# Patient Record
Sex: Female | Born: 1950
Health system: Southern US, Community
[De-identification: ages and names within clinical notes are randomized; demographics above are authoritative.]

## PROBLEM LIST (undated history)

## (undated) DIAGNOSIS — E559 Vitamin D deficiency, unspecified: Secondary | ICD-10-CM

## (undated) DIAGNOSIS — I10 Essential (primary) hypertension: Secondary | ICD-10-CM

## (undated) DIAGNOSIS — E119 Type 2 diabetes mellitus without complications: Secondary | ICD-10-CM

## (undated) HISTORY — PX: CHOLECYSTECTOMY: SHX55

## (undated) HISTORY — DX: Essential (primary) hypertension: I10

---

## 2016-05-12 DIAGNOSIS — N950 Postmenopausal bleeding: Secondary | ICD-10-CM | POA: Diagnosis not present

## 2016-05-12 DIAGNOSIS — R8290 Unspecified abnormal findings in urine: Secondary | ICD-10-CM | POA: Diagnosis not present

## 2016-05-12 DIAGNOSIS — N899 Noninflammatory disorder of vagina, unspecified: Secondary | ICD-10-CM | POA: Diagnosis not present

## 2016-05-12 DIAGNOSIS — Z6841 Body Mass Index (BMI) 40.0 and over, adult: Secondary | ICD-10-CM | POA: Diagnosis not present

## 2016-05-21 DIAGNOSIS — N899 Noninflammatory disorder of vagina, unspecified: Secondary | ICD-10-CM | POA: Diagnosis not present

## 2016-05-21 DIAGNOSIS — K625 Hemorrhage of anus and rectum: Secondary | ICD-10-CM | POA: Diagnosis not present

## 2016-05-24 DIAGNOSIS — J45909 Unspecified asthma, uncomplicated: Secondary | ICD-10-CM | POA: Diagnosis not present

## 2016-05-24 DIAGNOSIS — N8501 Benign endometrial hyperplasia: Secondary | ICD-10-CM | POA: Diagnosis not present

## 2016-05-24 DIAGNOSIS — Z8049 Family history of malignant neoplasm of other genital organs: Secondary | ICD-10-CM | POA: Diagnosis not present

## 2016-05-24 DIAGNOSIS — Z818 Family history of other mental and behavioral disorders: Secondary | ICD-10-CM | POA: Diagnosis not present

## 2016-05-24 DIAGNOSIS — Z803 Family history of malignant neoplasm of breast: Secondary | ICD-10-CM | POA: Diagnosis not present

## 2016-05-24 DIAGNOSIS — Z79899 Other long term (current) drug therapy: Secondary | ICD-10-CM | POA: Diagnosis not present

## 2016-05-24 DIAGNOSIS — Z880 Allergy status to penicillin: Secondary | ICD-10-CM | POA: Diagnosis not present

## 2016-05-24 DIAGNOSIS — Z1211 Encounter for screening for malignant neoplasm of colon: Secondary | ICD-10-CM | POA: Diagnosis not present

## 2016-05-24 DIAGNOSIS — K6289 Other specified diseases of anus and rectum: Secondary | ICD-10-CM | POA: Diagnosis not present

## 2016-05-24 DIAGNOSIS — K625 Hemorrhage of anus and rectum: Secondary | ICD-10-CM | POA: Diagnosis not present

## 2016-05-24 DIAGNOSIS — N950 Postmenopausal bleeding: Secondary | ICD-10-CM | POA: Diagnosis not present

## 2016-05-24 DIAGNOSIS — Q524 Other congenital malformations of vagina: Secondary | ICD-10-CM | POA: Diagnosis not present

## 2016-05-24 DIAGNOSIS — Z8489 Family history of other specified conditions: Secondary | ICD-10-CM | POA: Diagnosis not present

## 2016-05-24 DIAGNOSIS — N899 Noninflammatory disorder of vagina, unspecified: Secondary | ICD-10-CM | POA: Diagnosis not present

## 2016-05-24 DIAGNOSIS — E119 Type 2 diabetes mellitus without complications: Secondary | ICD-10-CM | POA: Diagnosis not present

## 2016-05-24 DIAGNOSIS — Z8249 Family history of ischemic heart disease and other diseases of the circulatory system: Secondary | ICD-10-CM | POA: Diagnosis not present

## 2016-05-24 DIAGNOSIS — Q519 Congenital malformation of uterus and cervix, unspecified: Secondary | ICD-10-CM | POA: Diagnosis not present

## 2016-05-24 DIAGNOSIS — Z9049 Acquired absence of other specified parts of digestive tract: Secondary | ICD-10-CM | POA: Diagnosis not present

## 2016-05-25 DIAGNOSIS — Z9049 Acquired absence of other specified parts of digestive tract: Secondary | ICD-10-CM | POA: Diagnosis not present

## 2016-05-25 DIAGNOSIS — Z79899 Other long term (current) drug therapy: Secondary | ICD-10-CM | POA: Diagnosis not present

## 2016-05-25 DIAGNOSIS — K6289 Other specified diseases of anus and rectum: Secondary | ICD-10-CM | POA: Diagnosis not present

## 2016-05-25 DIAGNOSIS — M069 Rheumatoid arthritis, unspecified: Secondary | ICD-10-CM | POA: Diagnosis not present

## 2016-05-25 DIAGNOSIS — Q519 Congenital malformation of uterus and cervix, unspecified: Secondary | ICD-10-CM | POA: Diagnosis not present

## 2016-05-25 DIAGNOSIS — N8501 Benign endometrial hyperplasia: Secondary | ICD-10-CM | POA: Diagnosis not present

## 2016-05-25 DIAGNOSIS — N848 Polyp of other parts of female genital tract: Secondary | ICD-10-CM | POA: Diagnosis not present

## 2016-05-25 DIAGNOSIS — K625 Hemorrhage of anus and rectum: Secondary | ICD-10-CM | POA: Diagnosis not present

## 2016-05-25 DIAGNOSIS — N8500 Endometrial hyperplasia, unspecified: Secondary | ICD-10-CM | POA: Diagnosis not present

## 2016-05-25 DIAGNOSIS — N898 Other specified noninflammatory disorders of vagina: Secondary | ICD-10-CM | POA: Diagnosis not present

## 2016-05-25 DIAGNOSIS — N950 Postmenopausal bleeding: Secondary | ICD-10-CM | POA: Diagnosis not present

## 2016-05-25 DIAGNOSIS — J45909 Unspecified asthma, uncomplicated: Secondary | ICD-10-CM | POA: Diagnosis not present

## 2016-05-25 DIAGNOSIS — Z1211 Encounter for screening for malignant neoplasm of colon: Secondary | ICD-10-CM | POA: Diagnosis not present

## 2016-05-25 DIAGNOSIS — E119 Type 2 diabetes mellitus without complications: Secondary | ICD-10-CM | POA: Diagnosis not present

## 2016-05-25 DIAGNOSIS — Q524 Other congenital malformations of vagina: Secondary | ICD-10-CM | POA: Diagnosis not present

## 2016-05-25 DIAGNOSIS — N899 Noninflammatory disorder of vagina, unspecified: Secondary | ICD-10-CM | POA: Diagnosis not present

## 2016-05-25 LAB — HM COLONOSCOPY

## 2016-06-01 DIAGNOSIS — J45909 Unspecified asthma, uncomplicated: Secondary | ICD-10-CM | POA: Diagnosis not present

## 2016-06-01 DIAGNOSIS — I10 Essential (primary) hypertension: Secondary | ICD-10-CM | POA: Diagnosis not present

## 2016-06-01 DIAGNOSIS — E119 Type 2 diabetes mellitus without complications: Secondary | ICD-10-CM | POA: Diagnosis not present

## 2016-06-01 DIAGNOSIS — K210 Gastro-esophageal reflux disease with esophagitis: Secondary | ICD-10-CM | POA: Diagnosis not present

## 2016-09-17 DIAGNOSIS — Z23 Encounter for immunization: Secondary | ICD-10-CM | POA: Diagnosis not present

## 2016-10-19 DIAGNOSIS — R05 Cough: Secondary | ICD-10-CM | POA: Diagnosis not present

## 2016-10-19 DIAGNOSIS — I700 Atherosclerosis of aorta: Secondary | ICD-10-CM | POA: Diagnosis not present

## 2016-10-19 DIAGNOSIS — J209 Acute bronchitis, unspecified: Secondary | ICD-10-CM | POA: Diagnosis not present

## 2016-11-15 HISTORY — PX: TUMOR REMOVAL: SHX12

## 2017-01-11 DIAGNOSIS — J45909 Unspecified asthma, uncomplicated: Secondary | ICD-10-CM | POA: Diagnosis not present

## 2017-01-11 DIAGNOSIS — E785 Hyperlipidemia, unspecified: Secondary | ICD-10-CM | POA: Diagnosis not present

## 2017-01-11 DIAGNOSIS — E669 Obesity, unspecified: Secondary | ICD-10-CM | POA: Diagnosis not present

## 2017-01-11 DIAGNOSIS — I10 Essential (primary) hypertension: Secondary | ICD-10-CM | POA: Diagnosis not present

## 2017-01-11 DIAGNOSIS — E119 Type 2 diabetes mellitus without complications: Secondary | ICD-10-CM | POA: Diagnosis not present

## 2017-01-11 DIAGNOSIS — K210 Gastro-esophageal reflux disease with esophagitis: Secondary | ICD-10-CM | POA: Diagnosis not present

## 2017-01-11 DIAGNOSIS — Z0000 Encounter for general adult medical examination without abnormal findings: Secondary | ICD-10-CM | POA: Diagnosis not present

## 2017-08-22 DIAGNOSIS — Z23 Encounter for immunization: Secondary | ICD-10-CM | POA: Diagnosis not present

## 2017-09-20 DIAGNOSIS — E119 Type 2 diabetes mellitus without complications: Secondary | ICD-10-CM | POA: Diagnosis not present

## 2017-09-20 DIAGNOSIS — I10 Essential (primary) hypertension: Secondary | ICD-10-CM | POA: Diagnosis not present

## 2017-09-20 DIAGNOSIS — J441 Chronic obstructive pulmonary disease with (acute) exacerbation: Secondary | ICD-10-CM | POA: Diagnosis not present

## 2017-09-20 DIAGNOSIS — E669 Obesity, unspecified: Secondary | ICD-10-CM | POA: Diagnosis not present

## 2018-03-16 DIAGNOSIS — E119 Type 2 diabetes mellitus without complications: Secondary | ICD-10-CM | POA: Diagnosis not present

## 2018-03-16 DIAGNOSIS — J45909 Unspecified asthma, uncomplicated: Secondary | ICD-10-CM | POA: Diagnosis not present

## 2018-03-16 DIAGNOSIS — J301 Allergic rhinitis due to pollen: Secondary | ICD-10-CM | POA: Diagnosis not present

## 2018-03-16 DIAGNOSIS — I10 Essential (primary) hypertension: Secondary | ICD-10-CM | POA: Diagnosis not present

## 2018-04-19 DIAGNOSIS — E559 Vitamin D deficiency, unspecified: Secondary | ICD-10-CM | POA: Diagnosis not present

## 2018-04-19 DIAGNOSIS — Z0000 Encounter for general adult medical examination without abnormal findings: Secondary | ICD-10-CM | POA: Diagnosis not present

## 2018-04-19 DIAGNOSIS — J45909 Unspecified asthma, uncomplicated: Secondary | ICD-10-CM | POA: Diagnosis not present

## 2018-04-19 DIAGNOSIS — E1169 Type 2 diabetes mellitus with other specified complication: Secondary | ICD-10-CM | POA: Diagnosis not present

## 2018-04-19 DIAGNOSIS — J301 Allergic rhinitis due to pollen: Secondary | ICD-10-CM | POA: Diagnosis not present

## 2018-04-19 DIAGNOSIS — E119 Type 2 diabetes mellitus without complications: Secondary | ICD-10-CM | POA: Diagnosis not present

## 2018-04-19 DIAGNOSIS — I10 Essential (primary) hypertension: Secondary | ICD-10-CM | POA: Diagnosis not present

## 2018-04-19 LAB — MICROALBUMIN, URINE: Microalb, Ur: 27.7

## 2018-04-20 DIAGNOSIS — Z0000 Encounter for general adult medical examination without abnormal findings: Secondary | ICD-10-CM | POA: Diagnosis not present

## 2018-06-15 ENCOUNTER — Other Ambulatory Visit: Payer: Self-pay

## 2018-07-13 DIAGNOSIS — J301 Allergic rhinitis due to pollen: Secondary | ICD-10-CM | POA: Diagnosis not present

## 2018-07-13 DIAGNOSIS — E1169 Type 2 diabetes mellitus with other specified complication: Secondary | ICD-10-CM | POA: Diagnosis not present

## 2018-07-13 DIAGNOSIS — E785 Hyperlipidemia, unspecified: Secondary | ICD-10-CM | POA: Diagnosis not present

## 2018-07-13 DIAGNOSIS — E669 Obesity, unspecified: Secondary | ICD-10-CM | POA: Diagnosis not present

## 2018-07-13 DIAGNOSIS — J45909 Unspecified asthma, uncomplicated: Secondary | ICD-10-CM | POA: Diagnosis not present

## 2018-07-13 DIAGNOSIS — I10 Essential (primary) hypertension: Secondary | ICD-10-CM | POA: Diagnosis not present

## 2018-07-13 DIAGNOSIS — K210 Gastro-esophageal reflux disease with esophagitis: Secondary | ICD-10-CM | POA: Diagnosis not present

## 2018-07-13 DIAGNOSIS — E119 Type 2 diabetes mellitus without complications: Secondary | ICD-10-CM | POA: Diagnosis not present

## 2018-07-21 DIAGNOSIS — E1165 Type 2 diabetes mellitus with hyperglycemia: Secondary | ICD-10-CM | POA: Diagnosis not present

## 2018-07-21 DIAGNOSIS — E669 Obesity, unspecified: Secondary | ICD-10-CM | POA: Diagnosis not present

## 2018-07-21 DIAGNOSIS — I10 Essential (primary) hypertension: Secondary | ICD-10-CM | POA: Diagnosis not present

## 2018-07-21 DIAGNOSIS — R42 Dizziness and giddiness: Secondary | ICD-10-CM | POA: Diagnosis not present

## 2018-07-21 DIAGNOSIS — Z23 Encounter for immunization: Secondary | ICD-10-CM | POA: Diagnosis not present

## 2018-11-15 LAB — HM MAMMOGRAPHY: HM Mammogram: NORMAL (ref 0–4)

## 2018-11-29 ENCOUNTER — Other Ambulatory Visit: Payer: Self-pay

## 2018-11-29 ENCOUNTER — Emergency Department (HOSPITAL_COMMUNITY): Payer: Medicare Other

## 2018-11-29 ENCOUNTER — Encounter (HOSPITAL_COMMUNITY): Payer: Self-pay | Admitting: Emergency Medicine

## 2018-11-29 ENCOUNTER — Emergency Department (HOSPITAL_COMMUNITY)
Admission: EM | Admit: 2018-11-29 | Discharge: 2018-11-29 | Disposition: A | Discharge status: Home / Self Care | Payer: Medicare Other | Attending: Emergency Medicine | Admitting: Emergency Medicine

## 2018-11-29 DIAGNOSIS — Z7984 Long term (current) use of oral hypoglycemic drugs: Secondary | ICD-10-CM | POA: Diagnosis not present

## 2018-11-29 DIAGNOSIS — R000 Tachycardia, unspecified: Secondary | ICD-10-CM | POA: Diagnosis not present

## 2018-11-29 DIAGNOSIS — Z79899 Other long term (current) drug therapy: Secondary | ICD-10-CM | POA: Insufficient documentation

## 2018-11-29 DIAGNOSIS — M79661 Pain in right lower leg: Secondary | ICD-10-CM | POA: Diagnosis not present

## 2018-11-29 DIAGNOSIS — R079 Chest pain, unspecified: Secondary | ICD-10-CM | POA: Diagnosis not present

## 2018-11-29 DIAGNOSIS — E119 Type 2 diabetes mellitus without complications: Secondary | ICD-10-CM | POA: Insufficient documentation

## 2018-11-29 DIAGNOSIS — R0789 Other chest pain: Secondary | ICD-10-CM | POA: Diagnosis not present

## 2018-11-29 DIAGNOSIS — I700 Atherosclerosis of aorta: Secondary | ICD-10-CM | POA: Diagnosis not present

## 2018-11-29 DIAGNOSIS — R0602 Shortness of breath: Secondary | ICD-10-CM | POA: Diagnosis not present

## 2018-11-29 HISTORY — DX: Vitamin D deficiency, unspecified: E55.9

## 2018-11-29 HISTORY — DX: Type 2 diabetes mellitus without complications: E11.9

## 2018-11-29 LAB — CBC
HCT: 51.4 % — ABNORMAL HIGH (ref 36.0–46.0)
Hemoglobin: 16.3 g/dL — ABNORMAL HIGH (ref 12.0–15.0)
MCH: 28.3 pg (ref 26.0–34.0)
MCHC: 31.7 g/dL (ref 30.0–36.0)
MCV: 89.2 fL (ref 80.0–100.0)
Platelets: 234 10*3/uL (ref 150–400)
RBC: 5.76 MIL/uL — ABNORMAL HIGH (ref 3.87–5.11)
RDW: 13 % (ref 11.5–15.5)
WBC: 8 10*3/uL (ref 4.0–10.5)
nRBC: 0 % (ref 0.0–0.2)

## 2018-11-29 LAB — BASIC METABOLIC PANEL
Anion gap: 11 (ref 5–15)
BUN: 16 mg/dL (ref 8–23)
CHLORIDE: 99 mmol/L (ref 98–111)
CO2: 26 mmol/L (ref 22–32)
Calcium: 9.3 mg/dL (ref 8.9–10.3)
Creatinine, Ser: 0.73 mg/dL (ref 0.44–1.00)
GFR calc Af Amer: >60 mL/min (ref >60)
GFR calc non Af Amer: >60 mL/min (ref >60)
Glucose, Bld: 196 mg/dL — ABNORMAL HIGH (ref 70–99)
Potassium: 4.3 mmol/L (ref 3.5–5.1)
SODIUM: 136 mmol/L (ref 135–145)

## 2018-11-29 LAB — D-DIMER, QUANTITATIVE: D-Dimer, Quant: 0.48 ug/mL-FEU (ref 0.00–0.50)

## 2018-11-29 LAB — TSH: TSH: 1.13 u[IU]/mL (ref 0.350–4.500)

## 2018-11-29 LAB — TROPONIN I: Troponin I: <0.03 ng/mL (ref <0.03)

## 2018-11-29 MED ORDER — IOPAMIDOL (ISOVUE-370) INJECTION 76%
150.0000 mL | Freq: Once | INTRAVENOUS | Status: AC | PRN
  Administered 2018-11-29: 100 mL via INTRAVENOUS

## 2018-11-29 NOTE — ED Notes (Signed)
EKG given to Cook MD 

## 2018-11-29 NOTE — ED Triage Notes (Signed)
CP and sob since 0400 today.  Sent by Dr Juanetta Gosling office

## 2018-11-29 NOTE — Discharge Instructions (Addendum)
Tests were all normal the exception of some nonspecific tissue in your chest.  We discussed this briefly.  Follow-up with your primary care doctor.

## 2018-11-29 NOTE — ED Provider Notes (Signed)
[facility]   CSN: [id number] Arrival date & time: 11/29/18  1316     History   Chief Complaint Chief Complaint  Patient presents with  . Chest Pain    HPI Eduardo OsierShirley W Salas is a 68 y.o. female.  Patient sent to the ED by her primary care doctor for rapid heart rate since 4:30 AM.  Additionally, she complains of chest pain with radiation to the back.  This has never happened before.  Review of systems positive for dyspnea and right calf tenderness, but no diaphoresis or nausea.  She is diabetic.  Non-smoker.  No prolonged immobilization or recent travel.  Severity is moderate.  Nothing makes symptoms better or worse.     Past Medical History:  Diagnosis Date  . Diabetes mellitus without complication (HCC)   . Vitamin D deficiency     There are no active problems to display for this patient.   Past Surgical History:  Procedure Laterality Date  . CHOLECYSTECTOMY    . TUMOR REMOVAL  2018     OB History   No obstetric history on file.      Home Medications    Prior to Admission medications   Medication Sig Start Date End Date Taking? Authorizing Provider  Cholecalciferol (VITAMIN D) 50 MCG (2000 UT) CAPS Take 1 capsule by mouth daily.   Yes [provider]  glipiZIDE (GLUCOTROL) 5 MG tablet Take 5 mg by mouth 2 (two) times daily. 10/11/18  Yes [provider]  nystatin (MYCOSTATIN/NYSTOP) powder Apply 1 application topically daily as needed. 09/01/18  Yes [provider]  vitamin C (ASCORBIC ACID) 500 MG tablet Take 500 mg by mouth daily.   Yes [provider]    Family History No family history on file.  Social History Social History   Tobacco Use  . Smoking status: Never Smoker  . Smokeless tobacco: Never Used  Substance Use Topics  . Alcohol use: Never    Frequency: Never  . Drug use: Never     Allergies   Penicillins and Metformin and related   Review of Systems Review of  Systems  All other systems reviewed and are negative.    Physical Exam Updated Vital Signs BP 132/75   Pulse 84   Temp 98.2 F (36.8 C) (Oral)   Resp 11   Ht 5\' 2"  (1.575 m)   Wt 104.3 kg   SpO2 96%   BMI 42.07 kg/m   Physical Exam Vitals signs and nursing note reviewed.  Constitutional:      Appearance: She is well-developed.     Comments: Elevated bmi  HENT:     Head: Normocephalic and atraumatic.  Eyes:     Conjunctiva/sclera: Conjunctivae normal.  Neck:     Musculoskeletal: Neck supple.  Cardiovascular:     Rate and Rhythm: Regular rhythm. Tachycardia present.  Pulmonary:     Effort: Pulmonary effort is normal.     Breath sounds: Normal breath sounds.  Abdominal:     General: Bowel sounds are normal.     Palpations: Abdomen is soft.  Musculoskeletal: Normal range of motion.  Skin:    General: Skin is warm and dry.  Neurological:     Mental Status: She is alert and oriented to person, place, and time.  Psychiatric:        Behavior: Behavior normal.      ED Treatments / Results  Labs (all labs ordered are listed, but only abnormal results are  displayed) Labs Reviewed  BASIC METABOLIC PANEL - Abnormal; Notable for the following components:      Result Value   Glucose, Bld 196 (*)    All other components within normal limits  CBC - Abnormal; Notable for the following components:   RBC 5.76 (*)    Hemoglobin 16.3 (*)    HCT 51.4 (*)    All other components within normal limits  TROPONIN I  D-DIMER, QUANTITATIVE (NOT AT [facility])  TSH    EKG None  Date: 11/29/2018  Rate: 134  Rhythm: sinus tachy  QRS Axis: normal  Intervals: normal  ST/T Wave abnormalities: normal  Conduction Disutrbances: none  Narrative Interpretation: unremarkable    Radiology Dg Chest 2 View  Result Date: 11/29/2018 CLINICAL DATA:  Chest pain and shortness of breath since 0400 hours today EXAM: CHEST - 2 VIEW COMPARISON:  None FINDINGS: Upper normal heart size.  Mediastinal contours and pulmonary vascularity normal. Atherosclerotic calcification aorta. Minimal diffuse interstitial prominence of uncertain acuity. No segmental consolidation, pleural effusion or pneumothorax. Bones demineralized with LEFT glenohumeral degenerative changes noted. IMPRESSION: No definite acute infiltrates. Electronically Signed   By: Ulyses SouthwardMark  Boles M.D.   On: 11/29/2018 14:44   Ct Angio Chest Pe W And/or Wo Contrast  Result Date: 11/29/2018 CLINICAL DATA:  68 year old female with chest pain, pressure and shortness of breath since 0400 hours. EXAM: CT ANGIOGRAPHY CHEST WITH CONTRAST TECHNIQUE: Multidetector CT imaging of the chest was performed using the standard protocol during bolus administration of intravenous contrast. Multiplanar CT image reconstructions and MIPs were obtained to evaluate the vascular anatomy. CONTRAST:  100mL ISOVUE-370 IOPAMIDOL (ISOVUE-370) INJECTION 76% COMPARISON:  Chest radiographs 1425 hours today. FINDINGS: Cardiovascular: Adequate contrast bolus timing in the pulmonary arterial tree. No focal filling defect identified in the pulmonary arteries to suggest acute pulmonary embolism. Calcified coronary artery and Calcified aortic atherosclerosis. Mild cardiomegaly. No pericardial effusion. Mediastinum/Nodes: Bilateral abnormally increased hilar soft tissue appears fairly symmetric and might reflect hilar lymphadenopathy. The soft tissue ranges from 9 to 11 millimeters in thickness bilaterally. Mediastinal and right paratracheal lymph nodes are within normal limits. No axillary or thoracic inlet lymphadenopathy. Lungs/Pleura: Major airways are patent although some appear narrow due at the hila related to the increased hilar soft tissue. No endobronchial mass or lesion. Mild dependent atelectasis in both lungs. No pleural effusion. Mild increased subpleural reticular opacity in both mid to lower lungs. No lung nodule or mass identified. Upper Abdomen: Negative visible  liver, spleen, right adrenal gland and stomach. Musculoskeletal: No acute or suspicious osseous lesion. Left glenohumeral joint degeneration is moderate to severe. Review of the MIP images confirms the above findings. IMPRESSION: 1. Negative for acute pulmonary embolus. 2. Abnormal bilateral hilar soft tissue is fairly symmetric, nonspecific bilateral hilar lymphadenopathy is favored. No superimposed mediastinal, axillary, or upper abdominal lymphadenopathy. No lung nodule, lung mass, endobronchial lesion, or acute pulmonary process identified. Consider follow-up with Pulmonology. 3. Calcified coronary artery and aortic Atherosclerosis (ICD10-I70.0). Electronically Signed   By: Odessa FlemingH  Hall M.D.   On: 11/29/2018 17:14   [place] Venous Img Lower Unilateral Right  Result Date: 11/29/2018 CLINICAL DATA:  Right calf pain and short of breath for 1 day EXAM: RIGHT LOWER EXTREMITY VENOUS DUPLEX ULTRASOUND TECHNIQUE: Doppler venous assessment of the right lower extremity deep venous system was performed, including characterization of spectral flow, compressibility, and phasicity. COMPARISON:  None. FINDINGS: There is complete compressibility of the right common femoral, femoral, and popliteal veins. Doppler analysis demonstrates respiratory phasicity  and augmentation of flow with calf compression. No obvious superficial vein or calf vein thrombosis. IMPRESSION: No evidence of right lower extremity DVT. Electronically Signed   By: Jolaine Click M.D.   On: 11/29/2018 15:44    Procedures Procedures (including critical care time)  Medications Ordered in ED Medications  iopamidol (ISOVUE-370) 76 % injection 150 mL (100 mLs Intravenous Contrast Given 11/29/18 1655)     Initial Impression / Assessment and Plan / ED Course  I have reviewed the triage vital signs and the nursing notes.  Pertinent labs & imaging results that were available during my care of the patient were reviewed by me and considered in my medical decision  making (see chart for details).     Patient presents with tachycardia and chest pain of unknown etiology.  Her pulse normalized over time.  Screening tests including hemoglobin, TSH, Doppler right lower extremity, CT angiogram of chest all negative.  Glucose 196.  Abnormal soft tissue noted in the hilar area bilaterally.  This was discussed with the patient and her daughter-in-law.  She will follow-up with her primary care doctor.  Pulse normal at discharge.  Final Clinical Impressions(s) / ED Diagnoses   Final diagnoses:  Sinus tachycardia    ED Discharge Orders    None       Donnetta Hutching, MD 11/29/18 [phone]

## 2018-12-05 ENCOUNTER — Encounter: Payer: Self-pay | Admitting: Family Medicine

## 2018-12-05 DIAGNOSIS — E1165 Type 2 diabetes mellitus with hyperglycemia: Secondary | ICD-10-CM | POA: Diagnosis not present

## 2018-12-08 DIAGNOSIS — R42 Dizziness and giddiness: Secondary | ICD-10-CM | POA: Diagnosis not present

## 2018-12-08 DIAGNOSIS — I10 Essential (primary) hypertension: Secondary | ICD-10-CM | POA: Diagnosis not present

## 2018-12-08 DIAGNOSIS — J301 Allergic rhinitis due to pollen: Secondary | ICD-10-CM | POA: Diagnosis not present

## 2018-12-08 DIAGNOSIS — J45909 Unspecified asthma, uncomplicated: Secondary | ICD-10-CM | POA: Diagnosis not present

## 2019-03-08 DIAGNOSIS — E119 Type 2 diabetes mellitus without complications: Secondary | ICD-10-CM | POA: Diagnosis not present

## 2019-03-08 DIAGNOSIS — I10 Essential (primary) hypertension: Secondary | ICD-10-CM | POA: Diagnosis not present

## 2019-03-08 DIAGNOSIS — J45909 Unspecified asthma, uncomplicated: Secondary | ICD-10-CM | POA: Diagnosis not present

## 2019-05-16 DIAGNOSIS — R0600 Dyspnea, unspecified: Secondary | ICD-10-CM | POA: Diagnosis not present

## 2019-05-16 DIAGNOSIS — I10 Essential (primary) hypertension: Secondary | ICD-10-CM | POA: Diagnosis not present

## 2019-05-16 DIAGNOSIS — E1169 Type 2 diabetes mellitus with other specified complication: Secondary | ICD-10-CM | POA: Diagnosis not present

## 2019-05-16 DIAGNOSIS — J301 Allergic rhinitis due to pollen: Secondary | ICD-10-CM | POA: Diagnosis not present

## 2019-06-14 ENCOUNTER — Other Ambulatory Visit: Payer: Self-pay

## 2019-08-03 DIAGNOSIS — E669 Obesity, unspecified: Secondary | ICD-10-CM | POA: Diagnosis not present

## 2019-08-03 DIAGNOSIS — E785 Hyperlipidemia, unspecified: Secondary | ICD-10-CM | POA: Diagnosis not present

## 2019-08-03 DIAGNOSIS — J45909 Unspecified asthma, uncomplicated: Secondary | ICD-10-CM | POA: Diagnosis not present

## 2019-08-03 DIAGNOSIS — I10 Essential (primary) hypertension: Secondary | ICD-10-CM | POA: Diagnosis not present

## 2019-08-03 DIAGNOSIS — E559 Vitamin D deficiency, unspecified: Secondary | ICD-10-CM | POA: Diagnosis not present

## 2019-08-03 DIAGNOSIS — E1169 Type 2 diabetes mellitus with other specified complication: Secondary | ICD-10-CM | POA: Diagnosis not present

## 2019-08-03 DIAGNOSIS — K210 Gastro-esophageal reflux disease with esophagitis: Secondary | ICD-10-CM | POA: Diagnosis not present

## 2019-08-03 LAB — COMPREHENSIVE METABOLIC PANEL
Albumin: 4 (ref 3.5–5.0)
GFR calc Af Amer: 73
GFR calc non Af Amer: 63
Globulin: 2.8

## 2019-08-03 LAB — BASIC METABOLIC PANEL
BUN: 19 (ref 4–21)
CO2: 26 — AB (ref 13–22)
Chloride: 101 (ref 99–108)
Creatinine: 0.9 (ref <=1.1)
Glucose: 157
Potassium: 4.3 (ref 3.4–5.3)
Sodium: 139 (ref 137–147)

## 2019-08-03 LAB — HEPATIC FUNCTION PANEL
ALT: 13 (ref 7–35)
AST: 16 (ref 13–35)
Alkaline Phosphatase: 88 (ref 25–125)
Bilirubin, Total: 0.5

## 2019-08-03 LAB — LIPID PANEL
Cholesterol: 162 (ref 0–200)
HDL: 47 (ref 35–70)
LDL Cholesterol: 100
Triglycerides: 67 (ref 40–160)

## 2019-08-03 LAB — VITAMIN D 25 HYDROXY (VIT D DEFICIENCY, FRACTURES): Vit D, 25-Hydroxy: 22

## 2019-08-03 LAB — HEMOGLOBIN A1C: Hemoglobin A1C: 7.7

## 2019-08-08 DIAGNOSIS — E669 Obesity, unspecified: Secondary | ICD-10-CM | POA: Diagnosis not present

## 2019-08-08 DIAGNOSIS — I10 Essential (primary) hypertension: Secondary | ICD-10-CM | POA: Diagnosis not present

## 2019-08-08 DIAGNOSIS — Z23 Encounter for immunization: Secondary | ICD-10-CM | POA: Diagnosis not present

## 2019-08-08 DIAGNOSIS — E1165 Type 2 diabetes mellitus with hyperglycemia: Secondary | ICD-10-CM | POA: Diagnosis not present

## 2019-08-08 DIAGNOSIS — E785 Hyperlipidemia, unspecified: Secondary | ICD-10-CM | POA: Diagnosis not present

## 2019-10-08 ENCOUNTER — Other Ambulatory Visit: Payer: Self-pay

## 2019-10-08 ENCOUNTER — Encounter (INDEPENDENT_AMBULATORY_CARE_PROVIDER_SITE_OTHER): Payer: Self-pay

## 2019-10-08 ENCOUNTER — Encounter: Payer: Self-pay | Admitting: Family Medicine

## 2019-10-08 ENCOUNTER — Ambulatory Visit (INDEPENDENT_AMBULATORY_CARE_PROVIDER_SITE_OTHER): Payer: Medicare Other | Admitting: Family Medicine

## 2019-10-08 VITALS — BP 177/92 | HR 95 | Temp 97.8°F | Resp 15 | Ht 62.0 in | Wt 241.2 lb

## 2019-10-08 DIAGNOSIS — E119 Type 2 diabetes mellitus without complications: Secondary | ICD-10-CM | POA: Diagnosis not present

## 2019-10-08 DIAGNOSIS — M25561 Pain in right knee: Secondary | ICD-10-CM

## 2019-10-08 DIAGNOSIS — G8929 Other chronic pain: Secondary | ICD-10-CM

## 2019-10-08 DIAGNOSIS — H9313 Tinnitus, bilateral: Secondary | ICD-10-CM | POA: Diagnosis not present

## 2019-10-08 DIAGNOSIS — R42 Dizziness and giddiness: Secondary | ICD-10-CM

## 2019-10-08 DIAGNOSIS — I10 Essential (primary) hypertension: Secondary | ICD-10-CM

## 2019-10-08 DIAGNOSIS — E1165 Type 2 diabetes mellitus with hyperglycemia: Secondary | ICD-10-CM | POA: Insufficient documentation

## 2019-10-08 DIAGNOSIS — M25562 Pain in left knee: Secondary | ICD-10-CM

## 2019-10-08 NOTE — Progress Notes (Signed)
New Patient Office Visit  Subjective:  Patient ID: Tracy Salas, female    DOB: 04-09-1951  Age: 68 y.o. MRN: [id number]  CC:  Chief Complaint  Patient presents with  . New Patient (Initial Visit)    establish care  DM/HTN-evaluation last in 9/20-labwork at Dr. Juanetta Gosling  HPI Tracy Salas presents for DM-10/2019-[facility] app[facility], takes glipizide twice a day. Pt taking glucose readings at home 120's fasting per son. Pt has swelling in the LE bilat, no loss of sensation. Pt had blood work complete by Dr. Juanetta Gosling in Sept-A1c 7.7% pt states diagnosis 3 years ago  HTN-BP 140/65 on home monitor-currently taking bp medication at 10am in the morning Dr. Candie Echevaria polyp uterus -benign-advised on other pap smears-waiting on mammogram due to COVID Vit D -50,000units weekly-high dose -restarted last month-Vit D 22 -9/20 MV-calcium Left knee pain-replacement recommended-pt declined-injections in the past-uses a cane. Lives with son -adaptation for bathing and ramp for entering and leaving the home  Pt with acute cough-took zpack called in by Dr. Juanetta Gosling. Pt with wheezing in the morning , cough productive -clear.  No tob exposure.  Pt with allergies, no asthma or emphysema. Pt drinks water to thin secretions Past Medical History:  Diagnosis Date  . Diabetes mellitus without complication (HCC)   . Vitamin D deficiency     Past Surgical History:  Procedure Laterality Date  . CHOLECYSTECTOMY    . TUMOR REMOVAL  2018    Family History  Problem Relation Age of Onset  . Alzheimer's disease Mother   . Stroke Father     Social History   Socioeconomic History  . Marital status: Single    Spouse name: Not on file  . Number of children: Not on file  . Years of education: Not on file  . Highest education level: Not on file  Occupational History  . Not on file  Social Needs  . Financial resource strain: Not on file  . Food insecurity    Worry: Not on file    Inability:  Not on file  . Transportation needs    Medical: Not on file    Non-medical: Not on file  Tobacco Use  . Smoking status: Never Smoker  . Smokeless tobacco: Never Used  Substance and Sexual Activity  . Alcohol use: Never    Frequency: Never  . Drug use: Never  . Sexual activity: Not on file  Lifestyle  . Physical activity    Days per week: Not on file    Minutes per session: Not on file  . Stress: Not on file  Relationships  . Social Musician on phone: Not on file    Gets together: Not on file    Attends religious service: Not on file    Active member of club or organization: Not on file    Attends meetings of clubs or organizations: Not on file    Relationship status: Not on file  . Intimate partner violence    Fear of current or ex partner: Not on file    Emotionally abused: Not on file    Physically abused: Not on file    Forced sexual activity: Not on file  Other Topics Concern  . Not on file  Social History Narrative  . Not on file    ROS Review of Systems  Constitutional: Negative.   HENT: Positive for tinnitus.   Eyes:       Needs eye exam for  this year  Respiratory: Positive for cough and wheezing.   Cardiovascular: Negative.   Gastrointestinal: Negative.   Endocrine:       DM  Musculoskeletal: Positive for arthralgias.       Knee pain  Skin: Negative.   Allergic/Immunologic: Positive for environmental allergies.  Neurological: Positive for dizziness and headaches.  Hematological: Negative.   Psychiatric/Behavioral: Negative.     Objective:   Today's Vitals: BP (!) 177/92   Pulse 95   Temp 97.8 F (36.6 C) (Oral)   Resp 15   Ht 5\' 2"  (1.575 m)   Wt 241 lb 3.2 oz (109.4 kg)   SpO2 96%   BMI 44.12 kg/m   Physical Exam Constitutional:      Appearance: Normal appearance.  HENT:     Head: Atraumatic.     Right Ear: Tympanic membrane, ear canal and external ear normal.     Left Ear: Tympanic membrane, ear canal and external ear  normal.     Nose: Nose normal.     Mouth/Throat:     Mouth: Mucous membranes are moist.  Eyes:     Conjunctiva/sclera: Conjunctivae normal.  Neck:     Musculoskeletal: Normal range of motion and neck supple.  Cardiovascular:     Rate and Rhythm: Normal rate and regular rhythm.     Pulses: Normal pulses.     Heart sounds: Normal heart sounds.  Pulmonary:     Effort: Pulmonary effort is normal.     Breath sounds: Normal breath sounds.  Musculoskeletal:     Right lower leg: No edema.     Left lower leg: No edema.  Neurological:     Mental Status: She is alert and oriented to person, place, and time.  Psychiatric:        Mood and Affect: Mood normal.        Behavior: Behavior normal.     Assessment & Plan:  1. Hypertension, unspecified type Diltiazem-180mg -take at night(previously taken in the morning) currently not stabilized to preferred goal -recheck blood pressure in the morning-goal 130/80 If not improvement, need additional medication for control.  Follow up visit with bp readings to adjust medication if needed  2. Tinnitus, unspecified laterality Previous concern with no evaluation-gradually improved with no intervention. Blood pressure medication was started during this first onset of symptoms - Ambulatory referral to ENT  3. Dizziness Noted with tinnitus-previously noted this year with gradual resolution. labwork reviewed 9/20. Congestion noted -advised mucinex for thick mucous - Ambulatory referral to ENT  4 Controlled type 2 diabetes mellitus without complication, without long-term current use of insulin (HCC) A1c 7.7-d/w pt taking glipizide with food-will recheck -no adjustment made in medication in Sept. Will recheck in March   5. Chronic pain of both knees Recommendation for knee replacement 10 years ago-pt declined and uses cane to ambulate  Outpatient Encounter Medications as of 10/08/2019  Medication Sig  . azithromycin (ZITHROMAX) 250 MG tablet Take 250  mg by mouth daily.  . clotrimazole-betamethasone (LOTRISONE) cream Apply 1 application topically 2 (two) times daily.  10/10/2019 diltiazem (CARDIZEM CD) 180 MG 24 hr capsule Take 180 mg by mouth daily.  [name] glipiZIDE (GLUCOTROL) 5 MG tablet Take 5 mg by mouth 2 (two) times daily.  [name] nystatin (MYCOSTATIN/NYSTOP) powder Apply 1 application topically daily as needed.  . vitamin C (ASCORBIC ACID) 500 MG tablet Take 500 mg by mouth daily.  . Vitamin D, Ergocalciferol, (DRISDOL) 1.25 MG (50000 UT) CAPS capsule Take 50,000 Units by  mouth once a week.  . [DISCONTINUED] Cholecalciferol (VITAMIN D) 50 MCG (2000 UT) CAPS Take 1 capsule by mouth daily.   No facility-administered encounter medications on file as of 10/08/2019.     Follow-up: 3 weeks by phone with bp readings to discuss adjustment in medication if needed LISA Hannah Beat, MD

## 2019-10-08 NOTE — Patient Instructions (Addendum)
Check blood pressure first thing in the morning -write it down Switch blood pressure medication to evening dosing-Take at night Dont take glipizide if you don't eat Take blood glucose fasting and after largest meal Call for monitor covered under insurance mucinex 12 hour 600mg (can use adult or children)

## 2019-10-29 ENCOUNTER — Telehealth (INDEPENDENT_AMBULATORY_CARE_PROVIDER_SITE_OTHER): Payer: Medicare Other | Admitting: Family Medicine

## 2019-10-29 ENCOUNTER — Other Ambulatory Visit: Payer: Self-pay

## 2019-10-29 DIAGNOSIS — I10 Essential (primary) hypertension: Secondary | ICD-10-CM | POA: Diagnosis not present

## 2019-10-29 MED ORDER — DILTIAZEM HCL ER COATED BEADS 120 MG PO CP24: 120.0000 mg | ORAL_CAPSULE | Freq: Every day | ORAL | 1 refills | Status: DC

## 2019-10-29 NOTE — Patient Instructions (Signed)
Take diltiazem at night-120mg -take blood pressure reading first thing in the morning and in the evening.

## 2019-10-29 NOTE — Progress Notes (Signed)
Virtual Visit via Telephone Note  I connected with Tracy Salas on 10/29/19 at  1:40 PM EST by telephone and verified that I am speaking with the correct person using two identifiers.  Location: Patient:home Provider: clinic   I discussed the limitations, risks, security and privacy concerns of performing an evaluation and management service by telephone and the availability of in person appointments. I also discussed with the patient that there may be a patient responsible charge related to this service. The patient expressed understanding and agreed to proceed.   History of Present Illness: Pt with 120mg  dose of diltiazem started earlier this year-increase in Sept to 180mg  dose. Pt states she was previously taken in the morning and now taking at night   Observations/Objective: 11/27 130/44 118/70 115/62 113/58  Assessment and Plan: 1. Hypertension, unspecified type D/w pt decrease back to Cardizem 120mg   Follow Up Instructions: Continue to take blood pressure first thing in the morning and evening   I discussed the assessment and treatment plan with the patient. The patient was provided an opportunity to ask questions and all were answered. The patient agreed with the plan and demonstrated an understanding of the instructions.   The patient was advised to continue checking blood pressure at home.  I provided 10 minutes of non-face-to-face time during this encounter.   Eligah Anello Hannah Beat, MD

## 2019-11-21 ENCOUNTER — Other Ambulatory Visit: Payer: Self-pay | Admitting: Family Medicine

## 2019-11-21 NOTE — Telephone Encounter (Signed)
Requested medication (s) are due for refill today: yes  Requested medication (s) are on the active medication list: yes  Last refill:  10/29/2019  Future visit scheduled: yes  Notes to clinic: review for refill   Requested Prescriptions  Pending Prescriptions Disp Refills   diltiazem (CARDIZEM CD) 120 MG 24 hr capsule [Pharmacy Med Name: DILTIAZEM 24H ER(CD) 120 MG CP] 30 capsule 1    Sig: TAKE 1 CAPSULE BY MOUTH EVERY DAY      There is no refill protocol information for this order

## 2019-11-29 ENCOUNTER — Telehealth: Payer: Medicare Other | Admitting: Family Medicine

## 2019-11-29 ENCOUNTER — Other Ambulatory Visit: Payer: Self-pay

## 2019-11-29 ENCOUNTER — Telehealth (INDEPENDENT_AMBULATORY_CARE_PROVIDER_SITE_OTHER): Payer: Medicare Other | Admitting: Family Medicine

## 2019-11-29 VITALS — BP 124/70 | Ht 62.0 in | Wt 241.0 lb

## 2019-11-29 DIAGNOSIS — E559 Vitamin D deficiency, unspecified: Secondary | ICD-10-CM

## 2019-11-29 DIAGNOSIS — I10 Essential (primary) hypertension: Secondary | ICD-10-CM | POA: Diagnosis not present

## 2019-11-29 DIAGNOSIS — E119 Type 2 diabetes mellitus without complications: Secondary | ICD-10-CM | POA: Diagnosis not present

## 2019-12-03 DIAGNOSIS — E559 Vitamin D deficiency, unspecified: Secondary | ICD-10-CM | POA: Insufficient documentation

## 2019-12-03 NOTE — Progress Notes (Signed)
Virtual Visit via Telephone Note  I connected with Tracy Salas on 11/29/19 at 12:40 PM EST by telephone and verified that I am speaking with the correct person using two identifiers.  Location: Patient: home Provider: office   I discussed the limitations, risks, security and privacy concerns of performing an evaluation and management service by telephone and the availability of in person appointments. I also discussed with the patient that there may be a patient responsible charge related to this service. The patient expressed understanding and agreed to proceed.   History of Present Illness: Pt taking blood pressure at home. Decrease in medication due to low blood pressure readings in December. Pt denies dizziness, headaches or other concerns   Observations/Objective:  133/48, 151/70, 116/63, 122/65, 126/65 Assessment and Plan: 1. Hypertension, unspecified type Continue diltiazem for blood pressure daily-keep taking morning blood pressure and anytime you experience dizziness, headaches, nausea or other concerns - Hemoglobin A1c - Lipid panel - COMPLETE METABOLIC PANEL WITH GFR  2. Controlled type 2 diabetes mellitus without complication, without long-term current use of insulin (HCC) - Hemoglobin A1c Glucotrol daily 3. Vitamin D deficiency - VITAMIN D 25 Hydroxy (Vit-D Deficiency, Fractures) Follow Up Instructions: Fasting labwork   I discussed the assessment and treatment plan with the patient. The patient was provided an opportunity to ask questions and all were answered. The patient agreed with the plan and demonstrated an understanding of the instructions.   The patient was advised to call back or seek an in-person evaluation if the symptoms worsen or if the condition fails to improve as anticipated.  I provided 8 minutes of non-face-to-face time during this encounter.   Alayasia Breeding Mat Carne, MD

## 2019-12-18 ENCOUNTER — Other Ambulatory Visit: Payer: Self-pay | Admitting: Family Medicine

## 2020-01-10 ENCOUNTER — Other Ambulatory Visit: Payer: Self-pay | Admitting: Family Medicine

## 2020-01-10 DIAGNOSIS — Z23 Encounter for immunization: Secondary | ICD-10-CM | POA: Diagnosis not present

## 2020-02-04 ENCOUNTER — Other Ambulatory Visit: Payer: Self-pay | Admitting: Family Medicine

## 2020-02-08 DIAGNOSIS — Z23 Encounter for immunization: Secondary | ICD-10-CM | POA: Diagnosis not present

## 2020-03-03 ENCOUNTER — Other Ambulatory Visit: Payer: Self-pay | Admitting: Family Medicine

## 2020-03-10 ENCOUNTER — Ambulatory Visit (INDEPENDENT_AMBULATORY_CARE_PROVIDER_SITE_OTHER): Payer: Medicare Other | Admitting: Family Medicine

## 2020-03-10 ENCOUNTER — Encounter: Payer: Self-pay | Admitting: Family Medicine

## 2020-03-10 ENCOUNTER — Other Ambulatory Visit: Payer: Self-pay

## 2020-03-10 VITALS — BP 180/83 | HR 96 | Temp 98.0°F | Ht 62.0 in | Wt 228.0 lb

## 2020-03-10 DIAGNOSIS — E119 Type 2 diabetes mellitus without complications: Secondary | ICD-10-CM

## 2020-03-10 DIAGNOSIS — L309 Dermatitis, unspecified: Secondary | ICD-10-CM | POA: Diagnosis not present

## 2020-03-10 DIAGNOSIS — R42 Dizziness and giddiness: Secondary | ICD-10-CM

## 2020-03-10 DIAGNOSIS — M25562 Pain in left knee: Secondary | ICD-10-CM | POA: Diagnosis not present

## 2020-03-10 DIAGNOSIS — I10 Essential (primary) hypertension: Secondary | ICD-10-CM

## 2020-03-10 DIAGNOSIS — M25561 Pain in right knee: Secondary | ICD-10-CM | POA: Diagnosis not present

## 2020-03-10 DIAGNOSIS — G8929 Other chronic pain: Secondary | ICD-10-CM

## 2020-03-10 DIAGNOSIS — M542 Cervicalgia: Secondary | ICD-10-CM | POA: Diagnosis not present

## 2020-03-10 HISTORY — DX: Dermatitis, unspecified: L30.9

## 2020-03-10 MED ORDER — BLOOD GLUCOSE METER KIT: PACK | 0 refills | Status: DC

## 2020-03-10 MED ORDER — CYCLOBENZAPRINE HCL 10 MG PO TABS: 10.0000 mg | ORAL_TABLET | Freq: Every day | ORAL | 0 refills | Status: DC

## 2020-03-10 MED ORDER — CLOBETASOL PROPIONATE 0.05 % EX CREA: 1.0000 "application " | TOPICAL_CREAM | Freq: Two times a day (BID) | CUTANEOUS | 0 refills | Status: DC

## 2020-03-10 NOTE — Progress Notes (Signed)
Established Patient Office Visit  Subjective:  Patient ID: Tracy Salas, female    DOB: 1951-02-15  Age: 69 y.o. MRN: [id number]  CC:  Chief Complaint  Patient presents with  . Ear Pain    left ear pain x2week and dizziness off/on for 2 months. Sharp pain from ear down the neck when lay on left ear     HPI SHARLON PFOHL presents for left ear pain-dizziness Left ear pain radiation into the neck-no injury, no prior h/o of PE tubes, infection No hearing loss Glucose -fasting-120, after evening meal-145-glucotrol daily HTN-bp [id number] systolic, higher in the evening 140 Arthritis bilat-advil-prn for knee pain Past Medical History:  Diagnosis Date  . Diabetes mellitus without complication ([place])   . Vitamin D deficiency     Past Surgical History:  Procedure Laterality Date  . CHOLECYSTECTOMY    . TUMOR REMOVAL  2018    Family History  Problem Relation Age of Onset  . Alzheimer's disease Mother   . Stroke Father     Social History   Socioeconomic History  . Marital status: Single    Spouse name: Not on file  . Number of children: Not on file  . Years of education: Not on file  . Highest education level: Not on file  Occupational History  . Not on file  Tobacco Use  . Smoking status: Never Smoker  . Smokeless tobacco: Never Used  Substance and Sexual Activity  . Alcohol use: Never  . Drug use: Never  . Sexual activity: Not on file  Other Topics Concern  . Not on file  Social History Narrative  . Not on file   Social Determinants of Health   Financial Resource Strain:   . Difficulty of Paying Living Expenses:   Food Insecurity:   . Worried About Charity fundraiser in the Last Year:   . Arboriculturist in the Last Year:   Transportation Needs:   . Film/video editor (Medical):   [name] Lack of Transportation (Non-Medical):   Physical Activity:   . Days of Exercise per Week:   . Minutes of Exercise per Session:   Stress:   . Feeling of  Stress :   Social Connections:   . Frequency of Communication with Friends and Family:   . Frequency of Social Gatherings with Friends and Family:   . Attends Religious Services:   . Active Member of Clubs or Organizations:   . Attends Archivist Meetings:   [name] Marital Status:   Intimate Partner Violence:   . Fear of Current or Ex-Partner:   . Emotionally Abused:   [name] Physically Abused:   . Sexually Abused:     Outpatient Medications Prior to Visit  Medication Sig Dispense Refill  . diltiazem (CARDIZEM CD) 120 MG 24 hr capsule TAKE 1 CAPSULE BY MOUTH EVERY DAY 30 capsule 1  . glipiZIDE (GLUCOTROL) 5 MG tablet Take 5 mg by mouth 2 (two) times daily.    [name] nystatin (MYCOSTATIN/NYSTOP) powder Apply 1 application topically daily as needed.    . vitamin C (ASCORBIC ACID) 500 MG tablet Take 500 mg by mouth daily.    . Vitamin D, Ergocalciferol, (DRISDOL) 1.25 MG (50000 UT) CAPS capsule Take 50,000 Units by mouth once a week.    [name] azithromycin (ZITHROMAX) 250 MG tablet Take 250 mg by mouth daily.    . clotrimazole-betamethasone (LOTRISONE) cream Apply 1 application topically 2 (two) times daily.     No  facility-administered medications prior to visit.    Allergies  Allergen Reactions  . Penicillins Shortness Of Breath and Rash    Did it involve swelling of the face/tongue/throat, SOB, or low BP? Yes Did it involve sudden or severe rash/hives, skin peeling, or any reaction on the inside of your mouth or nose? Yes Did you need to seek medical attention at a hospital or doctor's office? Unknown When did it last happen?over 56 years--69 years old If all above answers are "NO", may proceed with cephalosporin use.   . Metformin And Related Itching and Other (See Comments)    Shaking, altered mental status    ROS Review of Systems  HENT: Positive for ear pain. Negative for ear discharge and hearing loss.   Respiratory: Negative.   Cardiovascular: Negative.     Gastrointestinal: Negative.   Musculoskeletal: Positive for arthralgias and gait problem.  Allergic/Immunologic: Negative.   Neurological: Positive for dizziness.  Psychiatric/Behavioral: Negative.       Objective:    Physical Exam  Constitutional: She is oriented to person, place, and time. She appears well-developed.  HENT:  Head: Normocephalic and atraumatic.  Right Ear: External ear normal.  Left Ear: External ear normal.  Nose: Nose normal.  Mouth/Throat: Oropharynx is clear and moist. No oropharyngeal exudate.  Eyes: Conjunctivae are normal.  Neck:    Pain noted in area of sternocleidomastoid-tenderness to palpation and ROM. No eryth, no lymph nodes palpable   Cardiovascular: Normal rate, regular rhythm, normal heart sounds and intact distal pulses.  Pulmonary/Chest: Effort normal and breath sounds normal.  Musculoskeletal:     Cervical back: No edema or erythema. Decreased range of motion.  Neurological: She is oriented to person, place, and time.  Skin:     Plaque-eryth-no papules, no vesicles    BP (!) 180/83 (BP Location: Right Arm, Patient Position: Sitting, Cuff Size: Large)   Pulse 96   Temp 98 F (36.7 C) (Temporal)   Ht 5\' 2"  (1.575 m)   Wt 228 lb (103.4 kg)   SpO2 98%   BMI 41.70 kg/m  Wt Readings from Last 3 Encounters:  03/10/20 228 lb (103.4 kg)  11/29/19 241 lb (109.3 kg)  10/08/19 241 lb 3.2 oz (109.4 kg)     Health Maintenance Due  Topic Date Due  . Hepatitis C Screening  Never done  . FOOT EXAM  Never done  . OPHTHALMOLOGY EXAM  Never done  . COVID-19 Vaccine (1) Never done  . TETANUS/TDAP  Never done  . DEXA SCAN  Never done  . PNA vac Low Risk Adult (1 of 2 - PCV13) Never done  . URINE MICROALBUMIN  04/20/2019  . HEMOGLOBIN A1C  01/31/2020     Lab Results  Component Value Date   TSH 1.130 11/29/2018   Lab Results  Component Value Date   WBC 8.0 11/29/2018   HGB 16.3 (H) 11/29/2018   HCT 51.4 (H) 11/29/2018   MCV 89.2  11/29/2018   PLT 234 11/29/2018   Lab Results  Component Value Date   NA 139 08/03/2019   K 4.3 08/03/2019   CO2 26 (A) 08/03/2019   GLUCOSE 196 (H) 11/29/2018   BUN 19 08/03/2019   CREATININE 0.9 08/03/2019   ALKPHOS 88 08/03/2019   AST 16 08/03/2019   ALT 13 08/03/2019   ALBUMIN 4.0 08/03/2019   CALCIUM 9.3 11/29/2018   ANIONGAP 11 11/29/2018   Lab Results  Component Value Date   CHOL 162 08/03/2019   Lab Results  Component Value Date   HDL 47 08/03/2019   Lab Results  Component Value Date   LDLCALC 100 08/03/2019   Lab Results  Component Value Date   TRIG 67 08/03/2019   No results found for: [facility] Lab Results  Component Value Date   HGBA1C 7.7 08/03/2019      Assessment & Plan:  1. Hypertension, unspecified type Check blood pressure daily Record-concern for elevation-cardizem 2. Dizziness ENT referral-TM clear-concern for vertigo  3. Chronic pain of both knees voltaren gel  4. Neck pain Flexeril-rx  5. Dermatitis, unspecified Elbow-clobetasol  Follow-up:    Danelle Curiale Mat Carne, MD

## 2020-03-10 NOTE — Patient Instructions (Addendum)
ENT referral for vertigo Check glucose fasting -2 hours after biggest meal Voltaren gel  Clobetasol -topical steroids -right elbow     If you have lab work done today you will be contacted with your lab results within the next 2 weeks.  If you have not heard from [place] then please contact us. The fastest way to get your results is to register for My Chart.   IF you received an x-ray today, you will receive an invoice from [facility]. Please contact [facility] at [phone] with questions or concerns regarding your invoice.   IF you received labwork today, you will receive an invoice from [facility]. Please contact [facility] at [phone] with questions or concerns regarding your invoice.   Our billing staff will not be able to assist you with questions regarding bills from these companies.  You will be contacted with the lab results as soon as they are available. The fastest way to get your results is to activate your My Chart account. Instructions are located on the last page of this paperwork. If you have not heard from [place] regarding the results in 2 weeks, please contact this office.

## 2020-03-11 ENCOUNTER — Other Ambulatory Visit: Payer: Self-pay | Admitting: Family Medicine

## 2020-03-11 ENCOUNTER — Encounter: Payer: Self-pay | Admitting: Family Medicine

## 2020-03-11 MED ORDER — GLIPIZIDE 5 MG PO TABS: 5.0000 mg | ORAL_TABLET | Freq: Two times a day (BID) | ORAL | 1 refills | Status: DC

## 2020-03-11 MED ORDER — DILTIAZEM HCL ER COATED BEADS 120 MG PO CP24: ORAL_CAPSULE | ORAL | 1 refills | Status: DC

## 2020-03-25 ENCOUNTER — Other Ambulatory Visit: Payer: Self-pay | Admitting: Family Medicine

## 2020-04-20 ENCOUNTER — Other Ambulatory Visit: Payer: Self-pay | Admitting: Family Medicine

## 2020-05-16 ENCOUNTER — Other Ambulatory Visit: Payer: Self-pay | Admitting: Family Medicine

## 2020-08-04 DIAGNOSIS — Z6841 Body Mass Index (BMI) 40.0 and over, adult: Secondary | ICD-10-CM | POA: Diagnosis not present

## 2020-08-04 DIAGNOSIS — E1129 Type 2 diabetes mellitus with other diabetic kidney complication: Secondary | ICD-10-CM | POA: Diagnosis not present

## 2020-08-04 DIAGNOSIS — R809 Proteinuria, unspecified: Secondary | ICD-10-CM | POA: Diagnosis not present

## 2020-08-04 DIAGNOSIS — E1165 Type 2 diabetes mellitus with hyperglycemia: Secondary | ICD-10-CM | POA: Diagnosis not present

## 2020-08-04 DIAGNOSIS — Z299 Encounter for prophylactic measures, unspecified: Secondary | ICD-10-CM | POA: Diagnosis not present

## 2020-08-04 DIAGNOSIS — I10 Essential (primary) hypertension: Secondary | ICD-10-CM | POA: Diagnosis not present

## 2020-08-04 DIAGNOSIS — Z789 Other specified health status: Secondary | ICD-10-CM | POA: Diagnosis not present

## 2020-09-02 DIAGNOSIS — Z23 Encounter for immunization: Secondary | ICD-10-CM | POA: Diagnosis not present

## 2020-09-23 ENCOUNTER — Ambulatory Visit (INDEPENDENT_AMBULATORY_CARE_PROVIDER_SITE_OTHER): Payer: Medicare Other | Admitting: Family Medicine

## 2020-09-23 ENCOUNTER — Other Ambulatory Visit: Payer: Self-pay

## 2020-09-23 ENCOUNTER — Encounter: Payer: Self-pay | Admitting: Family Medicine

## 2020-09-23 VITALS — BP 168/82 | HR 72 | Temp 96.5°F | Ht 62.0 in | Wt 232.0 lb

## 2020-09-23 DIAGNOSIS — G8929 Other chronic pain: Secondary | ICD-10-CM | POA: Diagnosis not present

## 2020-09-23 DIAGNOSIS — M25562 Pain in left knee: Secondary | ICD-10-CM

## 2020-09-23 DIAGNOSIS — Z6841 Body Mass Index (BMI) 40.0 and over, adult: Secondary | ICD-10-CM | POA: Diagnosis not present

## 2020-09-23 DIAGNOSIS — E1165 Type 2 diabetes mellitus with hyperglycemia: Secondary | ICD-10-CM | POA: Diagnosis not present

## 2020-09-23 DIAGNOSIS — M25561 Pain in right knee: Secondary | ICD-10-CM

## 2020-09-23 DIAGNOSIS — I10 Essential (primary) hypertension: Secondary | ICD-10-CM

## 2020-09-23 MED ORDER — DILTIAZEM HCL 90 MG PO TABS: 90.0000 mg | ORAL_TABLET | Freq: Every day | ORAL | 0 refills | Status: DC

## 2020-09-23 MED ORDER — BLOOD GLUCOSE METER KIT: PACK | 0 refills | Status: DC

## 2020-09-23 MED ORDER — LISINOPRIL-HYDROCHLOROTHIAZIDE 10-12.5 MG PO TABS: 1.0000 | ORAL_TABLET | Freq: Every day | ORAL | 1 refills | Status: DC

## 2020-09-23 MED ORDER — TRULICITY 0.75 MG/0.5ML ~~LOC~~ SOAJ: 0.7500 mg | SUBCUTANEOUS | 1 refills | Status: DC

## 2020-09-23 NOTE — Patient Instructions (Addendum)
  HAPPY FALL!  I appreciate the opportunity to provide you with care for your health and wellness. Today we discussed: establish care   Follow up: 2 weeks for BP follow up -phone  No labs or referrals today  Medication Changes:  Start taking Lisinopril Hydrochlorothiazide combo pill (low dose) daily  Plan: will go slow. Please check BP at home goal is 130/80, Heart rate of 60-100   Cardizem 90 mg daily for 14 days Call office and tell BP and heart rate readings and how you are feeling. If doing well we will continue the wean.  A1c is 8.5 % not well controlled. I will order Trulicity once weekly injections to start Continue the Glipizide for now  Check blood sugars every morning before eating and keep record  Please continue to practice social distancing to keep you, your family, and our community safe.  If you must go out, please wear a mask and practice good handwashing.  It was a pleasure to see you and I look forward to continuing to work together on your health and well-being. Please do not hesitate to call the office if you need care or have questions about your care.  Have a wonderful day and week. With Gratitude, Tereasa Coop, DNP, AGNP-BC

## 2020-09-23 NOTE — Progress Notes (Signed)
Subjective:  Patient ID: Tracy Salas, female    DOB: 03-12-1951  Age: 68 y.o. MRN: [id number]  CC:  Chief Complaint  Patient presents with  . New Patient (Initial Visit)    here to establish care. dizziness x2+ months intermittently      HPI  HPI  Tracy Salas is a 69 year old female that presents to establish care.  She has a history that includes but is not limited to diabetes, hypertension, vitamin D deficiency, chronic pain.  Previous patient of Dr. Luan Pulling.  And was seen by Dr. Holly Bodily a few times.  She lives with her son who presents with her today.  She denies having any extensive issues or concerns today outside of some dizziness that is been going on for couple weeks.  She is unsure of exactly what is causing it.  If his blood sugars or if it is blood pressure.  As blood pressure is reading high today.  She is taking glipizide as directed.  Sees her eye doctors regularly in December.  She reports glucose readings at home of 120s fasting.-In past but needs supplies reordered.  Last A1c check was in September 2020 at 7.7%.  Today's check in office demonstrated increased to 8.5%.   Has not been taking anything for blood pressure control outside of Cardizem.  Denies having any arrhythmia issues or concerns.  Denies having any palpitations in her past.  Son is unsure of when it was started but they are willing to have an adjustment of medication after extensive review today.  Left knee pain-replacement recommended-pt declined-injections in the past-uses a cane. Lives with son -adaptation for bathing and ramp for entering and leaving the home  Has had flu vaccine.  In addition to Covid plus booster.  Mammogram will be due in January.  Colonoscopy was last in 2017.  Denies having any active chest pain.  Denies having any headaches, vision changes, dizziness.  Denies having any excessive cough or shortness of breath.  Today patient denies signs and symptoms of COVID 19  infection including fever, chills, cough, shortness of breath, and headache. Past Medical, Surgical, Social History, Allergies, and Medications have been Reviewed.   Past Medical History:  Diagnosis Date  . Dermatitis, unspecified 03/10/2020  . Diabetes mellitus without complication ([place])   . Hypertension   . Vitamin D deficiency     Current Meds  Medication Sig  . blood glucose meter kit and supplies Dispense based on patient and insurance preference. Use up to four times daily as directed. (FOR ICD-10 E10.9, E11.9).  [name] glipiZIDE (GLUCOTROL) 5 MG tablet Take 1 tablet (5 mg total) by mouth 2 (two) times daily.  [name] nystatin (MYCOSTATIN/NYSTOP) powder Apply 1 application topically daily as needed.  . vitamin C (ASCORBIC ACID) 500 MG tablet Take 500 mg by mouth daily.  . Vitamin D, Ergocalciferol, (DRISDOL) 1.25 MG (50000 UT) CAPS capsule Take 50,000 Units by mouth once a week.  . [DISCONTINUED] blood glucose meter kit and supplies Dispense based on patient and insurance preference. Use up to four times daily as directed. (FOR ICD-10 E10.9, E11.9).  . [DISCONTINUED] diltiazem (CARDIZEM CD) 120 MG 24 hr capsule TAKE 1 CAPSULE BY MOUTH EVERY DAY    ROS:  Review of Systems  Constitutional: Negative.   HENT: Negative.   Eyes: Negative.   Respiratory: Negative.   Cardiovascular: Negative.   Gastrointestinal: Negative.   Genitourinary: Negative.   Musculoskeletal: Negative.   Skin: Negative.  Neurological: Positive for dizziness.  Endo/Heme/Allergies: Negative.   Psychiatric/Behavioral: Negative.      Objective:   Today's Vitals: BP (!) 168/82 (BP Location: Left Arm, Patient Position: Sitting, Cuff Size: Normal)   Pulse 72   Temp (!) 96.5 F (35.8 C) (Tympanic)   Ht '5\' 2"'  (1.575 m)   Wt 232 lb (105.2 kg)   SpO2 95%   BMI 42.43 kg/m  Vitals with BMI 09/23/2020 03/10/2020 03/10/2020  Height '5\' 2"'  - '5\' 2"'   Weight 232 lbs - 228 lbs  BMI 63.33 - 54.56  Systolic 256 389 373    Diastolic 82 83 428  Pulse 72 - 96     Physical Exam Vitals and nursing note reviewed.  Constitutional:      Appearance: Normal appearance. She is well-developed and well-groomed. She is morbidly obese.  HENT:     Head: Normocephalic and atraumatic.     Right Ear: External ear normal.     Left Ear: External ear normal.     Mouth/Throat:     Comments: Mask in place  Eyes:     General:        Right eye: No discharge.        Left eye: No discharge.     Conjunctiva/sclera: Conjunctivae normal.  Cardiovascular:     Rate and Rhythm: Normal rate and regular rhythm.     Pulses: Normal pulses.     Heart sounds: Normal heart sounds.  Pulmonary:     Effort: Pulmonary effort is normal.     Breath sounds: Normal breath sounds.  Musculoskeletal:     Cervical back: Normal range of motion and neck supple.     Comments: Kasandra Knudsen present   Skin:    General: Skin is warm.  Neurological:     General: No focal deficit present.     Mental Status: She is alert and oriented to person, place, and time.  Psychiatric:        Attention and Perception: Attention normal.        Mood and Affect: Mood normal.        Speech: Speech normal.        Behavior: Behavior normal. Behavior is cooperative.        Thought Content: Thought content normal.        Cognition and Memory: Cognition normal.        Judgment: Judgment normal.     Assessment   1. Morbid obesity with body mass index (BMI) of 40.0 to 44.9 in adult [facility])   2. Type 2 diabetes mellitus with hyperglycemia, without long-term current use of insulin (HCC)   3. Essential hypertension   4. Chronic pain of both knees     Tests ordered Orders Placed This Encounter  Procedures  . POCT glycosylated hemoglobin (Hb A1C)     Plan: Please see assessment and plan per problem list above.   Meds ordered this encounter  Medications  . lisinopril-hydrochlorothiazide (ZESTORETIC) 10-12.5 MG tablet    Sig: Take 1 tablet by mouth daily.     Dispense:  30 tablet    Refill:  1    Order Specific Question:   Supervising Provider    Answer:   SIMPSON, MARGARET E [[id number]]  . Dulaglutide (TRULICITY) 1.57 WI/2.0BT SOPN    Sig: Inject 0.75 mg into the skin once a week.    Dispense:  4 mL    Refill:  1    Order Specific Question:   Supervising Provider  Answer:   SIMPSON, MARGARET E [[id number]]  . blood glucose meter kit and supplies    Sig: Dispense based on patient and insurance preference. Use up to four times daily as directed. (FOR ICD-10 E10.9, E11.9).    Dispense:  1 each    Refill:  0    Order Specific Question:   Supervising Provider    Answer:   Fayrene Helper [[id number]]    Order Specific Question:   Number of strips    Answer:   100    Order Specific Question:   Number of lancets    Answer:   100  . diltiazem (CARDIZEM) 90 MG tablet    Sig: Take 1 tablet (90 mg total) by mouth daily.    Dispense:  14 tablet    Refill:  0    Order Specific Question:   Supervising Provider    Answer:   Fayrene Helper [[id number]]    Patient to follow-up in 2 weeks by phone   Note: This dictation was prepared with Dragon dictation along with smaller phrase technology. Similar sounding words can be transcribed inadequately or may not be corrected upon review. Any transcriptional errors that result from this process are unintentional.      Perlie Mayo, NP

## 2020-09-24 LAB — POCT GLYCOSYLATED HEMOGLOBIN (HGB A1C): Hemoglobin A1C: 8.5 % — AB (ref 4.0–5.6)

## 2020-09-25 ENCOUNTER — Encounter: Payer: Self-pay | Admitting: Family Medicine

## 2020-09-25 DIAGNOSIS — Z6841 Body Mass Index (BMI) 40.0 and over, adult: Secondary | ICD-10-CM | POA: Insufficient documentation

## 2020-09-25 NOTE — Assessment & Plan Note (Signed)
Diabetes not well controlled here in the office.  A1c was 8.5%.  Will add Trulicity to her treatment regimen.  Son says that he is willing to help her get the injection.  We will start low-dose symptom meter and supplies for them to check fasting blood sugars.  We will follow-up in a few weeks via MyChart report.  She would benefit from being on ACE inhibitor which was started today in addition to hydrochlorothiazide secondary to uncontrolled blood pressure.  We will need to get updated labs here in the near future.  Possibly needs to be on a statin medication as well.

## 2020-09-25 NOTE — Assessment & Plan Note (Signed)
Blood pressure is not well controlled at all.  She reports only being on Cardizem.  120 mg she denies having any heart arrhythmias or palpitations chest pain or discomfort.  Son is unsure of how long she is been on the medication they are willing to have a wean and adjustment as tolerated.  EKG was obtained in office to make sure no arrhythmias were detected.  Normal sinus rhythm at 90 bpm.  Normal ECG demonstrated.  We will do a slow wean and reduction of Cardizem and the start of lisinopril hydrochlorothiazide slowly weaning up.  Follow-up in 2 weeks with blood pressure readings.  And then in 2 more weeks in office for blood pressure readings at that time as well.  Son reports understanding of medication regime.  Extensive detail was written on AVS as well.

## 2020-09-25 NOTE — Assessment & Plan Note (Signed)
Uses cane for support.  Denies having any recent falls.

## 2020-09-25 NOTE — Assessment & Plan Note (Signed)
Obesity linked to type 2 diabetes, hypertension  Improved,   Tracy Salas is educated about the importance of exercise daily to help with weight management. A minumum of 30 minutes daily is recommended. Additionally, importance of healthy food choices  with portion control discussed..  Wt Readings from Last 3 Encounters:  09/23/20 232 lb (105.2 kg)  03/10/20 228 lb (103.4 kg)  11/29/19 241 lb (109.3 kg)

## 2020-09-30 ENCOUNTER — Other Ambulatory Visit: Payer: Self-pay

## 2020-09-30 DIAGNOSIS — E1165 Type 2 diabetes mellitus with hyperglycemia: Secondary | ICD-10-CM

## 2020-09-30 MED ORDER — BLOOD GLUCOSE METER KIT: PACK | 0 refills | Status: DC

## 2020-10-07 ENCOUNTER — Encounter: Payer: Self-pay | Admitting: Family Medicine

## 2020-10-07 ENCOUNTER — Telehealth (INDEPENDENT_AMBULATORY_CARE_PROVIDER_SITE_OTHER): Payer: Medicare Other | Admitting: Family Medicine

## 2020-10-07 ENCOUNTER — Other Ambulatory Visit: Payer: Self-pay

## 2020-10-07 ENCOUNTER — Telehealth: Payer: Self-pay | Admitting: Family Medicine

## 2020-10-07 DIAGNOSIS — E1165 Type 2 diabetes mellitus with hyperglycemia: Secondary | ICD-10-CM

## 2020-10-07 DIAGNOSIS — I10 Essential (primary) hypertension: Secondary | ICD-10-CM | POA: Diagnosis not present

## 2020-10-07 MED ORDER — DILTIAZEM HCL 60 MG PO TABS: 60.0000 mg | ORAL_TABLET | Freq: Every day | ORAL | 0 refills | Status: DC

## 2020-10-07 MED ORDER — BLOOD GLUCOSE METER KIT: PACK | 0 refills | Status: AC

## 2020-10-07 NOTE — Telephone Encounter (Signed)
Pt informed earlier today on phone visit.

## 2020-10-07 NOTE — Assessment & Plan Note (Signed)
Has done well with the tapering of Cardizem down to 90 mg.  We will do 60 mg for the next 2 weeks and see her back in the office.  Blood pressure is holding see note for details.  Denies having any signs or symptoms of uncontrolled heart rate including chest pain or fluttering sensations.  Additionally has no shortness of breath in conversation or reported

## 2020-10-07 NOTE — Telephone Encounter (Signed)
Patient son "[name]"called in and wanted to let the doctor know that her insurance will only cover test sugar 1xday.

## 2020-10-07 NOTE — Patient Instructions (Signed)
°  HAPPY FALL!  I appreciate the opportunity to provide you with care for your health and wellness. Today we discussed: BP medication wean   Follow up: 14 days from today  No labs or referrals today  Continue the wean of your diltiazem with 60 mg daily. Use tylenol for headaches, if they get to bad let me know.  If you develop chest pain, shortness of breath, or flutter of heart rate call the office.  Start checking blood sugar once daily in the morning.  Call if any issues or concerns.  Have a great Thanksgiving Holiday!  Please continue to practice social distancing to keep you, your family, and our community safe.  If you must go out, please wear a mask and practice good handwashing.  It was a pleasure to see you and I look forward to continuing to work together on your health and well-being. Please do not hesitate to call the office if you need care or have questions about your care.  Have a wonderful day and week. With Gratitude, Tereasa Coop, DNP, AGNP-BC

## 2020-10-07 NOTE — Progress Notes (Signed)
Virtual Visit via Telephone Note   This visit type was conducted due to national recommendations for restrictions regarding the COVID-19 Pandemic (e.g. social distancing) in an effort to limit this patient's exposure and mitigate transmission in our community.  Due to her co-morbid illnesses, this patient is at least at moderate risk for complications without adequate follow up.  This format is felt to be most appropriate for this patient at this time.  The patient did not have access to video technology/had technical difficulties with video requiring transitioning to audio format only (telephone).  All issues noted in this document were discussed and addressed.  No physical exam could be performed with this format.    Evaluation Performed:  Follow-up visit  Date:  10/07/2020   ID:  Tracy, Salas 11-11-1951, MRN [id number]  Patient Location: Home Provider Location: Office/Clinic  Participating in call includes: nurse-checking in, provider performing visit, patient as listed below.  Location of Patient: Home Location of Provider: Telehealth Consent was obtain for visit to be over via telehealth. I verified that I am speaking with the correct person using two identifiers.  PCP:  Perlie Mayo, NP   Chief Complaint: Medication wean follow-up   History of Present Illness:    Tracy Salas is a 69 y.o. female with history as stated below includes but not limited to diabetes, hypertension, vitamin D deficiency.  Presents back today via the phone secondary to me weaning her off of her diltiazem.  She is on 120 mg but she was feeling dizzy and not feeling well EKG was stable in the office.  Today she provides blood pressure readings from 110-120 over 50s to 70s..  With the highest being 130/74.  She reports however her rate in the 70s with the highest being 80.  Denies having any palpitations or chest pain or shortness of breath.  Reports some headaches have increased but  overall doing well with this.  She was started on lisinopril combo with hydrochlorothiazide and is tolerating this addition in with the wean.  Of note: has not been taking anything for blood pressure control outside of Cardizem.  Denies having any arrhythmia issues or concerns.  Denies having any palpitations in her past.  Son is unsure of when it was started but they are willing to have an adjustment of medication after extensive review today.  The patient does not have symptoms concerning for COVID-19 infection (fever, chills, cough, or new shortness of breath).   Past Medical, Surgical, Social History, Allergies, and Medications have been Reviewed.  Past Medical History:  Diagnosis Date  . Dermatitis, unspecified 03/10/2020  . Diabetes mellitus without complication ([place])   . Hypertension   . Vitamin D deficiency    Past Surgical History:  Procedure Laterality Date  . CHOLECYSTECTOMY    . TUMOR REMOVAL  2018     Current Meds  Medication Sig  . blood glucose meter kit and supplies Dispense based on patient and insurance preference. Use up to four times daily as directed. (FOR ICD-10 E10.9, E11.9).  [name] diltiazem (CARDIZEM) 90 MG tablet Take 1 tablet (90 mg total) by mouth daily.  . Dulaglutide (TRULICITY) 7.89 FY/1.0FB SOPN Inject 0.75 mg into the skin once a week.  [name] glipiZIDE (GLUCOTROL) 5 MG tablet Take 1 tablet (5 mg total) by mouth 2 (two) times daily.  [name] lisinopril-hydrochlorothiazide (ZESTORETIC) 10-12.5 MG tablet Take 1 tablet by mouth daily.  [name] nystatin (MYCOSTATIN/NYSTOP) powder Apply 1 application topically daily  as needed.  . vitamin C (ASCORBIC ACID) 500 MG tablet Take 500 mg by mouth daily.  . Vitamin D, Ergocalciferol, (DRISDOL) 1.25 MG (50000 UT) CAPS capsule Take 50,000 Units by mouth once a week.     Allergies:   Penicillins and Metformin and related   ROS:   Please see the history of present illness.    All other systems reviewed and are  negative.   Labs/Other Tests and Data Reviewed:    Recent Labs: No results found for requested labs within last 8760 hours.   Recent Lipid Panel Lab Results  Component Value Date/Time   CHOL 162 08/03/2019 12:00 AM   TRIG 67 08/03/2019 12:00 AM   HDL 47 08/03/2019 12:00 AM   LDLCALC 100 08/03/2019 12:00 AM    Wt Readings from Last 3 Encounters:  10/07/20 226 lb (102.5 kg)  09/23/20 232 lb (105.2 kg)  03/10/20 228 lb (103.4 kg)     Objective:    Vital Signs:  Ht '5\' 2"'  (1.575 m)   Wt 226 lb (102.5 kg)   BMI 41.34 kg/m    VITAL SIGNS:  reviewed GEN:  no acute distress RESPIRATORY:  No shortness of breath in conversation PSYCH:  normal affect  ASSESSMENT & PLAN:    1. Essential hypertension  - diltiazem (CARDIZEM) 60 MG tablet; Take 1 tablet (60 mg total) by mouth daily for 14 days.  Dispense: 14 tablet; Refill: 0  2. Type 2 diabetes mellitus with hyperglycemia, without long-term current use of insulin (HCC)  - blood glucose meter kit and supplies; Dispense based on patient and insurance preference. Use once daily in morning before eating. (FOR ICD-10 E10.9, E11.9).  Dispense: 1 each; Refill: 0   Time:   Today, I have spent 7 minutes with the patient with telehealth technology discussing the above problems.     Medication Adjustments/Labs and Tests Ordered: Current medicines are reviewed at length with the patient today.  Concerns regarding medicines are outlined above.   Tests Ordered: No orders of the defined types were placed in this encounter.   Medication Changes: No orders of the defined types were placed in this encounter.    Note: This dictation was prepared with Dragon dictation along with smaller phrase technology. Similar sounding words can be transcribed inadequately or may not be corrected upon review. Any transcriptional errors that result from this process are unintentional.      Disposition:  Follow up 2 weeks in office Signed, Perlie Mayo, NP  10/07/2020 11:33 AM     [place]

## 2020-10-13 ENCOUNTER — Other Ambulatory Visit: Payer: Self-pay | Admitting: Family Medicine

## 2020-10-16 ENCOUNTER — Other Ambulatory Visit: Payer: Self-pay | Admitting: Family Medicine

## 2020-10-16 DIAGNOSIS — I10 Essential (primary) hypertension: Secondary | ICD-10-CM

## 2020-10-21 ENCOUNTER — Telehealth: Payer: Medicare Other | Admitting: Family Medicine

## 2020-10-21 ENCOUNTER — Telehealth (INDEPENDENT_AMBULATORY_CARE_PROVIDER_SITE_OTHER): Payer: Medicare Other | Admitting: Family Medicine

## 2020-10-21 ENCOUNTER — Encounter: Payer: Self-pay | Admitting: Family Medicine

## 2020-10-21 ENCOUNTER — Other Ambulatory Visit: Payer: Self-pay

## 2020-10-21 VITALS — BP 109/53 | Ht 62.0 in | Wt 220.0 lb

## 2020-10-21 DIAGNOSIS — I10 Essential (primary) hypertension: Secondary | ICD-10-CM

## 2020-10-21 DIAGNOSIS — E1165 Type 2 diabetes mellitus with hyperglycemia: Secondary | ICD-10-CM

## 2020-10-21 MED ORDER — DILTIAZEM HCL 30 MG PO TABS: 30.0000 mg | ORAL_TABLET | Freq: Every day | ORAL | 0 refills | Status: DC

## 2020-10-21 NOTE — Progress Notes (Signed)
Virtual Visit via Telephone Note   This visit type was conducted due to national recommendations for restrictions regarding the COVID-19 Pandemic (e.g. social distancing) in an effort to limit this patient's exposure and mitigate transmission in our community.  Due to her co-morbid illnesses, this patient is at least at moderate risk for complications without adequate follow up.  This format is felt to be most appropriate for this patient at this time.  The patient did not have access to video technology/had technical difficulties with video requiring transitioning to audio format only (telephone).  All issues noted in this document were discussed and addressed.  No physical exam could be performed with this format.    Evaluation Performed:  Follow-up visit  Date:  10/21/2020   ID:  Tracy, Salas Mar 16, 1951, MRN [id number]  Patient Location: Home Provider Location: Office/Clinic  Participants: Nurse for intake and work up; Patient and Provider for Visit and Wrap up  Method of visit: Telephone  Location of Patient: Home Location of Provider: Office Consent was obtain for visit over the telephone. Services rendered by provider: Visit was performed via telephone  I verified that I am speaking with the correct person using two identifiers.  PCP:  Perlie Mayo, NP   Chief Complaint: BP and HR- as well as DM  History of Present Illness:    RAGAN DUHON is a 69 y.o. female with history as stated below includes but not limited to diabetes, hypertension, vitamin D deficiency.  Presents back today via the phone secondary to me weaning her off of her diltiazem.  She was on 120 mg, but she was feeling dizzy and not feeling well EKG was stable in the office.  We started the wean to 90 mg daily. And she followed up in 2 weeks.  Today she provides blood pressure readings from 100-120 over 50s to 60s.. However, she forgot to record her HR, last visit it was in the 47s with the  highest being 80.    She denies having any palpitations or chest pain or shortness of breath.  Reports some headaches have increased but overall doing well with this.  She was started on lisinopril combo with hydrochlorothiazide and is tolerating this addition in with the wean.  Of note: has not been taking anything for blood pressure control outside of Cardizem. Denies having any arrhythmia issues or concerns. Denies having any palpitations in her past. Son is unsure of when it was started but they are willing to have an adjustment of medication after extensive review today.  Additionally, her blood sugars have been doing well with a fasting range of 1220-130 since starting the Trulicity. She reports tolerating it well. Denies hypo or hyper glycemia. She is drinking water and trying to eat well and make better food choices.  The patient does not have symptoms concerning for COVID-19 infection (fever, chills, cough, or new shortness of breath).   Past Medical, Surgical, Social History, Allergies, and Medications have been Reviewed.  Past Medical History:  Diagnosis Date  . Dermatitis, unspecified 03/10/2020  . Diabetes mellitus without complication ([place])   . Hypertension   . Vitamin D deficiency    Past Surgical History:  Procedure Laterality Date  . CHOLECYSTECTOMY    . TUMOR REMOVAL  2018     Current Meds  Medication Sig  . blood glucose meter kit and supplies Dispense based on patient and insurance preference. Use once daily in morning before eating. (FOR ICD-10 E10.9,  E11.9).  [name] diltiazem (CARDIZEM) 60 MG tablet Take 1 tablet (60 mg total) by mouth daily for 14 days.  . Dulaglutide (TRULICITY) 9.78 ER/8.4XQ SOPN Inject 0.75 mg into the skin once a week.  [name] glipiZIDE (GLUCOTROL) 5 MG tablet Take 1 tablet (5 mg total) by mouth 2 (two) times daily.  [name] lisinopril-hydrochlorothiazide (ZESTORETIC) 10-12.5 MG tablet Take 1 tablet by mouth daily.  [name] nystatin (MYCOSTATIN/NYSTOP) powder  Apply 1 application topically daily as needed.  . vitamin C (ASCORBIC ACID) 500 MG tablet Take 500 mg by mouth daily.  . Vitamin D, Ergocalciferol, (DRISDOL) 1.25 MG (50000 UT) CAPS capsule Take 50,000 Units by mouth once a week.     Allergies:   Penicillins and Metformin and related   ROS:   Please see the history of present illness.    All other systems reviewed and are negative.   Labs/Other Tests and Data Reviewed:    Recent Labs: No results found for requested labs within last 8760 hours.   Recent Lipid Panel Lab Results  Component Value Date/Time   CHOL 162 08/03/2019 12:00 AM   TRIG 67 08/03/2019 12:00 AM   HDL 47 08/03/2019 12:00 AM   LDLCALC 100 08/03/2019 12:00 AM    Wt Readings from Last 3 Encounters:  10/21/20 220 lb (99.8 kg)  10/07/20 226 lb (102.5 kg)  09/23/20 232 lb (105.2 kg)     Objective:    Vital Signs:  BP (!) 109/53   Ht _0  (1.575 m)   Wt 220 lb (99.8 kg)   BMI 40.24 kg/m    VITAL SIGNS:  reviewed GEN:  no acute distress RESPIRATORY:  no shortness of breath in conversation  PSYCH:  normal affect  ASSESSMENT & PLAN:    1. Type 2 diabetes mellitus with hyperglycemia, without long-term current use of insulin ([place])   2. Essential hypertension  - diltiazem (CARDIZEM) 30 MG tablet; Take 1 tablet (30 mg total) by mouth daily for 14 days.  Dispense: 14 tablet; Refill: 0   Time:   Today, I have spent 7 minutes with the patient with telehealth technology discussing the above problems.     Medication Adjustments/Labs and Tests Ordered: Current medicines are reviewed at length with the patient today.  Concerns regarding medicines are outlined above.   Tests Ordered: No orders of the defined types were placed in this encounter.   Medication Changes: No orders of the defined types were placed in this encounter.    Disposition:  Follow up 2 weeks in office  Signed, Perlie Mayo, NP  10/21/2020 4:25 PM     [place]

## 2020-10-21 NOTE — Assessment & Plan Note (Signed)
Doing well on wean from cardizem. Moving to 30 mg from 60 mg now.  BP holding well, see note for details.   Denies having any S&S of uncontrolled heart rate including but not limited to chest pain or fluttering sensations. Denies Shortness of breath or cough as well.  In office for next appt to decide if able to stop or do every other day for one week before stopping.  Continue Zestoretic  Dash diet and exercise as tolerated encouraged

## 2020-10-21 NOTE — Patient Instructions (Addendum)
  I appreciate the opportunity to provide you with care for your health and wellness. Today we discussed: BP and Blood sugars   Follow up:  2 weeks In Office appt for BP and HR check   No labs or referrals today  Please continue to practice social distancing to keep you, your family, and our community safe.  If you must go out, please wear a mask and practice good handwashing.  It was a pleasure to see you and I look forward to continuing to work together on your health and well-being. Please do not hesitate to call the office if you need care or have questions about your care.  Have a wonderful day. With Gratitude, Tereasa Coop, DNP, AGNP-BC

## 2020-10-21 NOTE — Assessment & Plan Note (Signed)
Doing well with Trulicity CBG readings are in good range 120-130's Will continue to current medications and follow up in 2 months for A1c repeat.   Still needs statin start. On ACE now.

## 2020-11-05 ENCOUNTER — Other Ambulatory Visit: Payer: Self-pay

## 2020-11-05 ENCOUNTER — Encounter: Payer: Self-pay | Admitting: Family Medicine

## 2020-11-05 ENCOUNTER — Other Ambulatory Visit: Payer: Self-pay | Admitting: Family Medicine

## 2020-11-05 ENCOUNTER — Ambulatory Visit (INDEPENDENT_AMBULATORY_CARE_PROVIDER_SITE_OTHER): Payer: Medicare Other | Admitting: Family Medicine

## 2020-11-05 VITALS — BP 156/88 | HR 93 | Temp 98.9°F | Ht 62.0 in | Wt 223.0 lb

## 2020-11-05 DIAGNOSIS — E1165 Type 2 diabetes mellitus with hyperglycemia: Secondary | ICD-10-CM | POA: Diagnosis not present

## 2020-11-05 DIAGNOSIS — I10 Essential (primary) hypertension: Secondary | ICD-10-CM | POA: Diagnosis not present

## 2020-11-05 DIAGNOSIS — R42 Dizziness and giddiness: Secondary | ICD-10-CM | POA: Diagnosis not present

## 2020-11-05 DIAGNOSIS — Z6841 Body Mass Index (BMI) 40.0 and over, adult: Secondary | ICD-10-CM

## 2020-11-05 LAB — POCT CBG (FASTING - GLUCOSE)-MANUAL ENTRY: Glucose Fasting, POC: 163 mg/dL — AB (ref 70–99)

## 2020-11-05 MED ORDER — CLOTRIMAZOLE-BETAMETHASONE 1-0.05 % EX CREA: 1.0000 "application " | TOPICAL_CREAM | Freq: Two times a day (BID) | CUTANEOUS | 3 refills | Status: AC

## 2020-11-05 MED ORDER — MECLIZINE HCL 12.5 MG PO TABS: 12.5000 mg | ORAL_TABLET | Freq: Every day | ORAL | 0 refills | Status: DC

## 2020-11-05 MED ORDER — LISINOPRIL-HYDROCHLOROTHIAZIDE 10-12.5 MG PO TABS: 1.0000 | ORAL_TABLET | Freq: Every day | ORAL | 1 refills | Status: DC

## 2020-11-05 MED ORDER — DILTIAZEM HCL 30 MG PO TABS: 30.0000 mg | ORAL_TABLET | Freq: Every day | ORAL | 0 refills | Status: DC

## 2020-11-05 NOTE — Patient Instructions (Addendum)
  I appreciate the opportunity to provide you with care for your health and wellness. Today we discussed: continued medication wean   Follow up: 6-8 weeks   No labs or referrals today   Continue Cardizem until next appt  Check Blood sugar 4 times daily, log it and food, and symptoms   Check BP and heart rate daily as well.  Bring logs to next visit.  Please eat :)  Altamese Cabal Christmas and Happy New Year!  Please continue to practice social distancing to keep you, your family, and our community safe.  If you must go out, please wear a mask and practice good handwashing.  It was a pleasure to see you and I look forward to continuing to work together on your health and well-being. Please do not hesitate to call the office if you need care or have questions about your care.  Have a wonderful day. With Gratitude, Tereasa Coop, DNP, AGNP-BC

## 2020-11-05 NOTE — Assessment & Plan Note (Signed)
Unsure of cause given that it is reported she is not eating like she should. She also has had issues with vertigo in the past. But given the variety of s&S I do think DM might be part of issue, as she is not use to DM being this well controlled.  If DM is not cause, we will look at cardiology, PT and ENT referrals at next visit.

## 2020-11-05 NOTE — Assessment & Plan Note (Addendum)
Son reports she is not eating well. He thinks her BS is causing her dizziness, headaches, chest tightness, and shakiness. She reports that she east once maybe twice a day. She is hungry- but she wants foods she cant have that are not DM friendly. So, she is having a hard time with that.  She is taking her medication daily. And fasting blood sugars are 130's usually.   Blood sugar check in office was 163  Advised to check blood sugar 4 times daily for 2 weeks and bring in readings. Encourage to write symptoms and food on log as well.

## 2020-11-05 NOTE — Assessment & Plan Note (Signed)
Obesity is link to DM and HTN  She has improved perhaps due to [employer] is re-educated about the importance of exercise daily to help with weight management. A minumum of 30 minutes daily is recommended. Additionally, importance of healthy food choices  with portion control discussed.   Wt Readings from Last 3 Encounters:  10/21/20 220 lb (99.8 kg)  10/07/20 226 lb (102.5 kg)  09/23/20 232 lb (105.2 kg)

## 2020-11-05 NOTE — Assessment & Plan Note (Addendum)
Doing well on wean from cardizem.  But S&S of dizziness unable to r/u DM cause, will wait to stop Cardizem for now.   BP holding well, see note for details.    Continue Zestoretic

## 2020-11-05 NOTE — Progress Notes (Signed)
Subjective:  Patient ID: Tracy Salas, female    DOB: 01-12-1951  Age: 69 y.o. MRN: [id number]  CC:  Chief Complaint  Patient presents with  . Hypertension    F/u      HPI  HPI Tracy Salas is a 69 y.o. female with history as stated below. Presents today for follow up on medication changes secondary to me weaning her off of her diltiazem. She was on 120 mg, but she was feeling dizzy and not feeling well EKG was stable in the office. We started the wean to 90 mg daily. And she followed up in 2 weeks. She did well and at the next 2 weeks she has BP readings 110-120/50-60's. She had forgot to record HR.  She denies having any palpitations or chest pain or shortness of breath. Reports some headaches have increased but overall doing well with this. She was started on lisinopril combo with hydrochlorothiazide and is tolerating this addition in with the wean.  Of note: has not been taking anything for blood pressure control outside of Cardizem. Denies having any arrhythmia issues or concerns. Denies having any palpitations in her past. Son is unsure of when it was started but they are willing to have an adjustment of medication after extensive review today.  Additionally: Son reports she is not eating well. He thinks her BS is causing her dizziness, headaches, chest tightness, and shakiness. She reports that she east once maybe twice a day. She is hungry- but she wants foods she cant have that are not DM friendly. So, she is having a hard time with that.  She is taking her medication daily. And fasting blood sugars are 130's usually.   Today patient denies signs and symptoms of COVID 19 infection including fever, chills, cough, shortness of breath, and headache. Past Medical, Surgical, Social History, Allergies, and Medications have been Reviewed.   Past Medical History:  Diagnosis Date  . Dermatitis, unspecified 03/10/2020  . Diabetes mellitus without complication  ([place])   . Hypertension   . Vitamin D deficiency     Current Meds  Medication Sig  . blood glucose meter kit and supplies Dispense based on patient and insurance preference. Use once daily in morning before eating. (FOR ICD-10 E10.9, E11.9).  . Dulaglutide (TRULICITY) 5.39 JQ/7.3AL SOPN Inject 0.75 mg into the skin once a week.  [name] glipiZIDE (GLUCOTROL) 5 MG tablet Take 1 tablet (5 mg total) by mouth 2 (two) times daily.  [name] nystatin (MYCOSTATIN/NYSTOP) powder Apply 1 application topically daily as needed.  . vitamin C (ASCORBIC ACID) 500 MG tablet Take 500 mg by mouth daily.  . Vitamin D, Ergocalciferol, (DRISDOL) 1.25 MG (50000 UT) CAPS capsule Take 50,000 Units by mouth once a week.  . [DISCONTINUED] lisinopril-hydrochlorothiazide (ZESTORETIC) 10-12.5 MG tablet Take 1 tablet by mouth daily.    ROS:  Review of Systems  HENT: Negative.   Eyes: Negative.   Respiratory: Negative.   Cardiovascular: Negative.   Gastrointestinal: Negative.   Genitourinary: Negative.   Musculoskeletal: Negative.   Skin: Negative.   Neurological: Negative.   Endo/Heme/Allergies: Negative.   Psychiatric/Behavioral: Negative.      Objective:   Today's Vitals: BP (!) 156/88 (BP Location: Left Arm, Patient Position: Sitting, Cuff Size: Normal)   Pulse 93   Temp 98.9 F (37.2 C) (Tympanic)   Ht _0  (1.575 m)   Wt 223 lb (101.2 kg)   SpO2 96%   BMI 40.79 kg/m  Vitals with BMI  11/05/2020 10/21/2020 10/07/2020  Height _0  _1  _2   Weight 223 lbs 220 lbs 226 lbs  BMI 40.78 74.08 14.48  Systolic 185 631 -  Diastolic 88 53 -  Pulse 93 - -     Physical Exam Vitals and nursing note reviewed.  Constitutional:      Appearance: Normal appearance. She is well-developed and well-groomed. She is obese.  HENT:     Head: Normocephalic and atraumatic.     Right Ear: External ear normal.     Left Ear: External ear normal.     Mouth/Throat:     Comments: Mask in place  Eyes:     General:         Right eye: No discharge.        Left eye: No discharge.     Conjunctiva/sclera: Conjunctivae normal.  Cardiovascular:     Rate and Rhythm: Normal rate and regular rhythm.     Pulses: Normal pulses.     Heart sounds: Normal heart sounds.  Pulmonary:     Effort: Pulmonary effort is normal.     Breath sounds: Normal breath sounds.  Musculoskeletal:        General: Normal range of motion.     Cervical back: Normal range of motion and neck supple.  Skin:    General: Skin is warm.  Neurological:     General: No focal deficit present.     Mental Status: She is alert and oriented to person, place, and time.  Psychiatric:        Attention and Perception: Attention normal.        Mood and Affect: Mood normal.        Speech: Speech normal.        Behavior: Behavior normal. Behavior is cooperative.        Thought Content: Thought content normal.        Cognition and Memory: Cognition normal.        Judgment: Judgment normal.      Assessment   1. Morbid obesity with body mass index (BMI) of 40.0 to 44.9 in adult [facility])   2. Type 2 diabetes mellitus with hyperglycemia, without long-term current use of insulin (HCC)   3. Essential hypertension   4. Dizziness     Tests ordered Orders Placed This Encounter  Procedures  . POCT CBG (Fasting - Glucose)     Plan: Please see assessment and plan per problem list above.   Meds ordered this encounter  Medications  . diltiazem (CARDIZEM) 30 MG tablet    Sig: Take 1 tablet (30 mg total) by mouth daily for 14 days.    Dispense:  14 tablet    Refill:  0    Order Specific Question:   Supervising Provider    Answer:   SIMPSON, MARGARET E [[id number]]  . clotrimazole-betamethasone (LOTRISONE) cream    Sig: Apply 1 application topically 2 (two) times daily.    Dispense:  30 g    Refill:  3    Order Specific Question:   Supervising Provider    Answer:   SIMPSON, MARGARET E [[id number]]  . meclizine (ANTIVERT) 12.5 MG tablet    Sig: Take 1 tablet  (12.5 mg total) by mouth daily.    Dispense:  15 tablet    Refill:  0    Order Specific Question:   Supervising Provider    Answer:   SIMPSON, MARGARET E [[id number]]  . lisinopril-hydrochlorothiazide (ZESTORETIC) 10-12.5 MG tablet  Sig: Take 1 tablet by mouth daily.    Dispense:  30 tablet    Refill:  1    Order Specific Question:   Supervising Provider    Answer:   Fayrene Helper [[id number]]    Patient to follow-up in    Perlie Mayo, NP

## 2020-11-19 ENCOUNTER — Other Ambulatory Visit: Payer: Self-pay | Admitting: Family Medicine

## 2020-11-19 DIAGNOSIS — I10 Essential (primary) hypertension: Secondary | ICD-10-CM

## 2020-11-19 IMAGING — DX DG CHEST 2V
2 series · 2 of 2 positions shown · non-contrast
Comparison: None

CLINICAL DATA: Chest pain and shortness of breath since 8388 hours
today

EXAM:
CHEST - 2 VIEW

[chest lat]
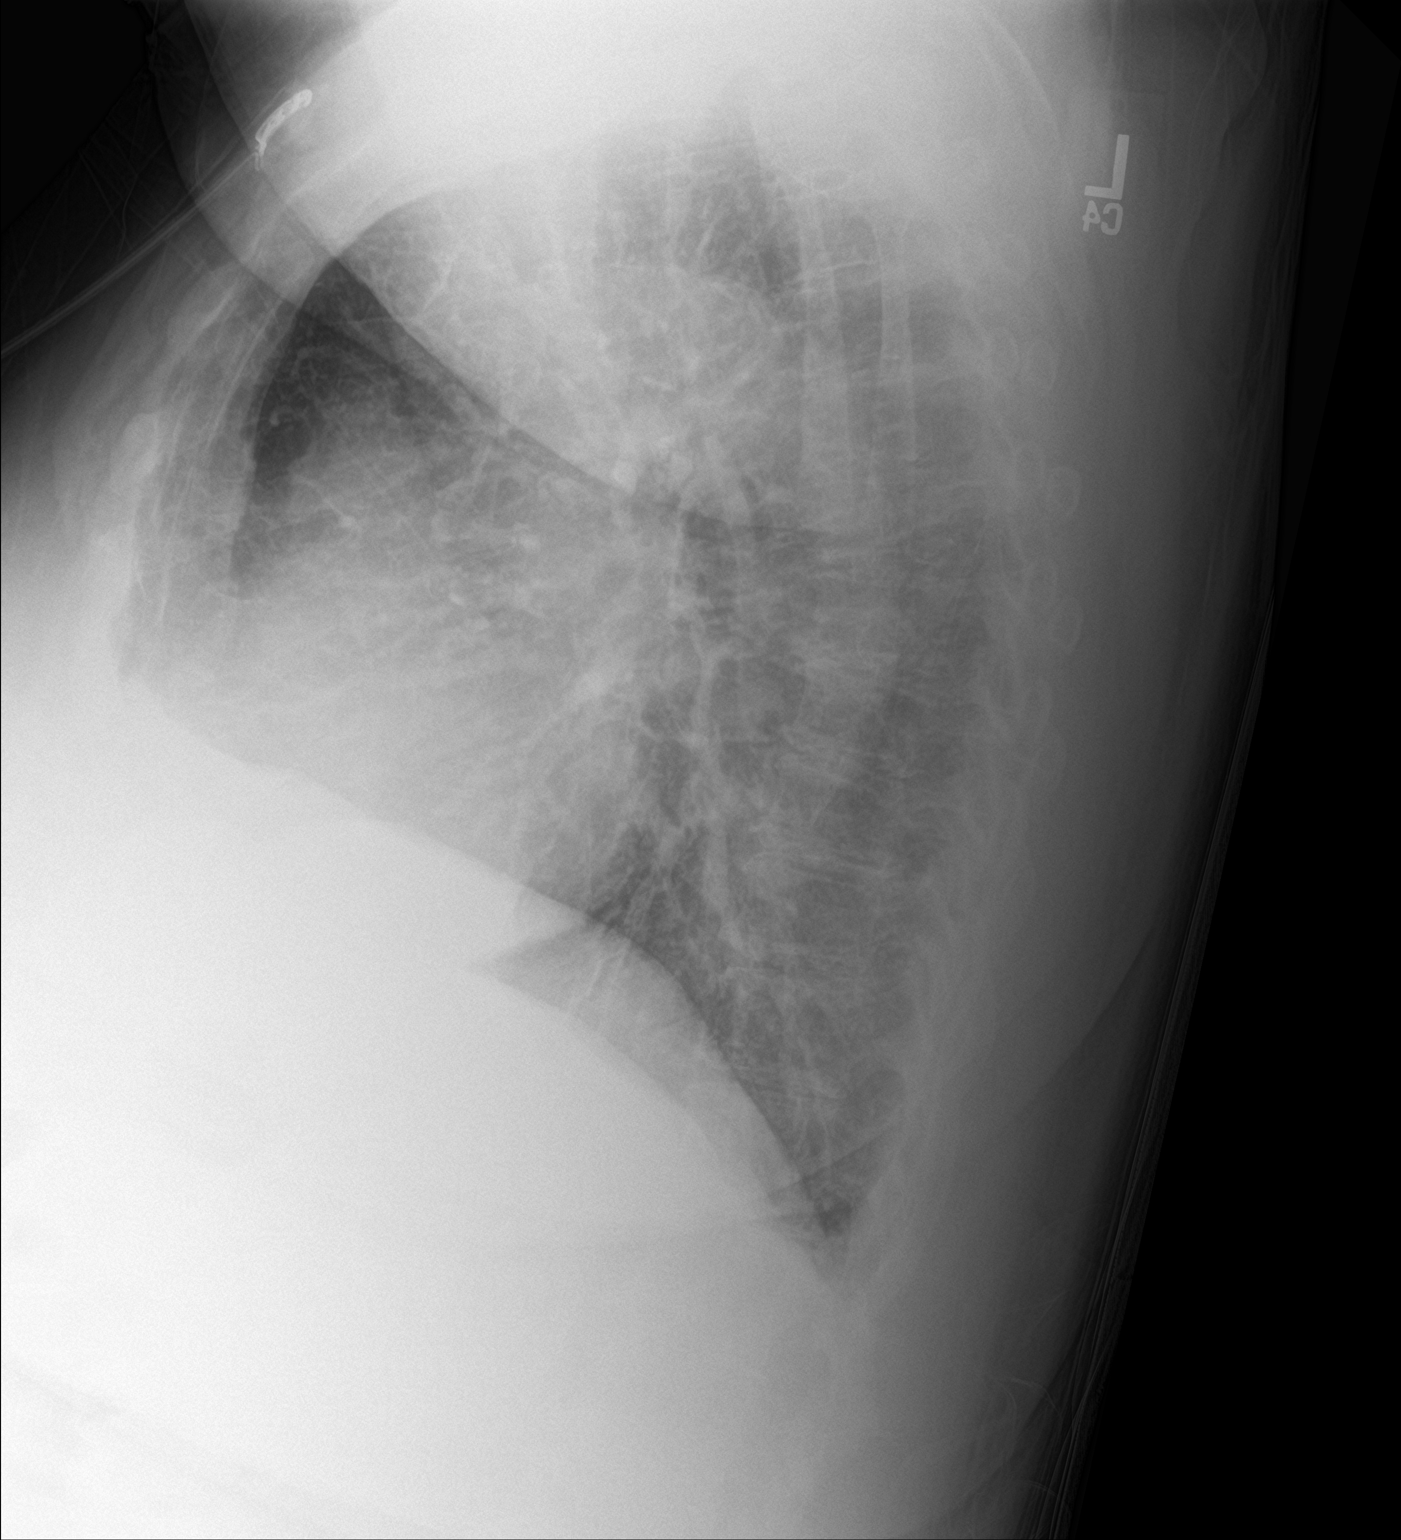

[chest ap]
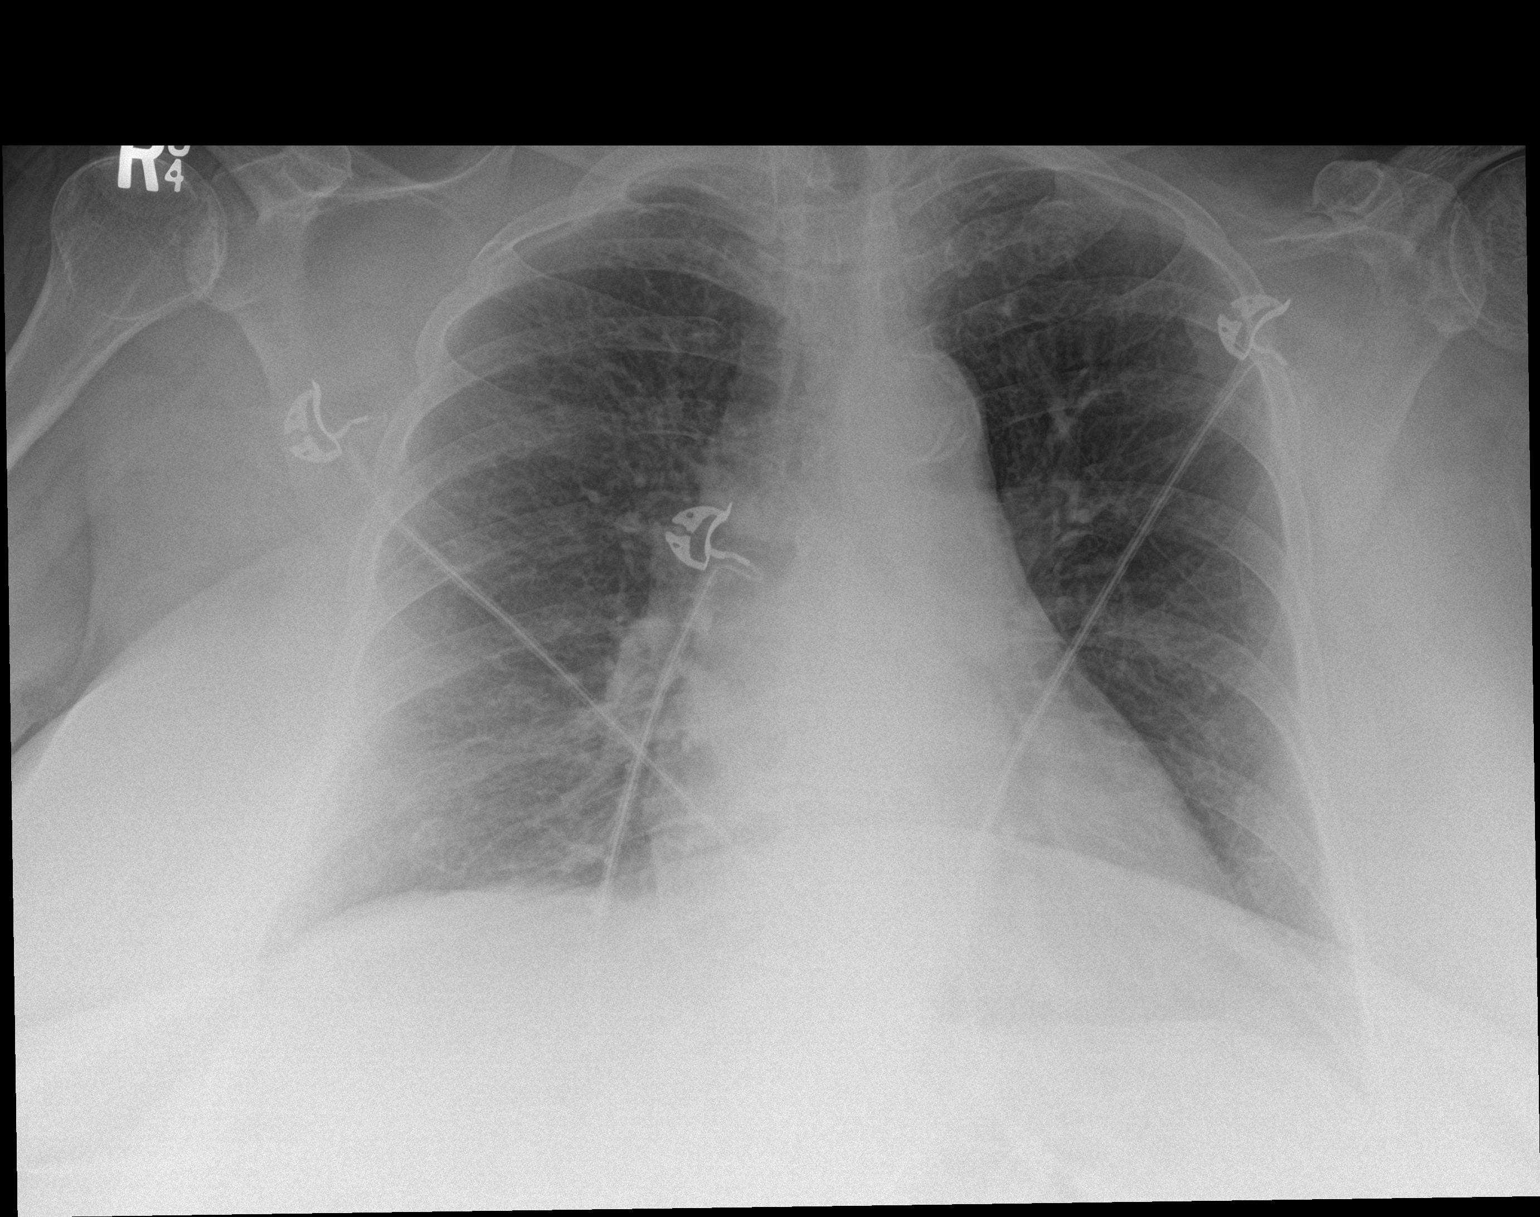

[2 of 2 positions shown; findings below may reference images not displayed]

FINDINGS: Upper normal heart size.

Mediastinal contours and pulmonary vascularity normal.

Atherosclerotic calcification aorta.

Minimal diffuse interstitial prominence of uncertain acuity.

No segmental consolidation, pleural effusion or pneumothorax.

Bones demineralized with LEFT glenohumeral degenerative changes
noted.
IMPRESSION: No definite acute infiltrates.

## 2020-11-19 MED ORDER — LISINOPRIL-HYDROCHLOROTHIAZIDE 10-12.5 MG PO TABS: 1.0000 | ORAL_TABLET | Freq: Every day | ORAL | 1 refills | Status: DC

## 2020-11-19 MED ORDER — DILTIAZEM HCL 30 MG PO TABS: 30.0000 mg | ORAL_TABLET | Freq: Every day | ORAL | 0 refills | Status: DC

## 2020-11-21 ENCOUNTER — Other Ambulatory Visit: Payer: Self-pay | Admitting: Family Medicine

## 2020-11-21 ENCOUNTER — Telehealth: Payer: Self-pay | Admitting: *Deleted

## 2020-11-21 MED ORDER — GLIPIZIDE 10 MG PO TABS: 5.0000 mg | ORAL_TABLET | Freq: Two times a day (BID) | ORAL | 1 refills | Status: DC

## 2020-11-21 NOTE — Telephone Encounter (Signed)
Please advise 

## 2020-11-21 NOTE — Telephone Encounter (Signed)
I thought there was a medicare plan- she told us yesterday about it. They just have to go on line to do it. I highly doubt that I can get anything injectable at a good cost with it being the new year.  Let me look at her medications and see if I can do another oral for this month until.Tracy KitchenMarland Salas

## 2020-11-21 NOTE — Telephone Encounter (Signed)
The card is for commercially insured pts only. They don't offer a free trial.

## 2020-11-21 NOTE — Progress Notes (Signed)
Increase dose of Glipizide to 10 mg BID for Jan due to insurance changes. Will go back to 5 mg BID and Trulicity in Feb.

## 2020-11-21 NOTE — Telephone Encounter (Signed)
We just got those coupons yesterday for this, lets see if that would help her for this month. If not, then we can look at something else for sure.

## 2020-11-21 NOTE — Telephone Encounter (Signed)
Son Tracy Salas called pt is in process of getting insurance and this doesn't take effect until 12-16-20 she cannot afford the trulicity so wanted to see if [facility] Client would like for pt to keep taking glipizide or send in something different until February please advise

## 2020-11-27 NOTE — Telephone Encounter (Signed)
I double checked the coupons and none said for medicare. [facility] has relion brand insulin vials for $25 cash price. They have novolog, novolin R and novolin 70/30. Willl either of these work? If in [place] the health dept has a pt assistance program but it would probably take until feb to get it processed

## 2020-11-28 ENCOUNTER — Telehealth: Payer: Self-pay | Admitting: Family Medicine

## 2020-11-28 NOTE — Telephone Encounter (Signed)
Pt son [name] called in and states that [facility] left a voicemail on patient phone with a change of medication. Pt son [name] called to see what the medication change was. Informed [name] I do not see where patient has signed a DPR stating we can speak with him. [name] states if she messes the medication up it is on [place]. Informed him it is HIPAA violation for [place] to release any information to him without a consent from the patient. He hung up.

## 2020-11-28 NOTE — Telephone Encounter (Signed)
Left message

## 2020-11-28 NOTE — Telephone Encounter (Signed)
Pt son informed. 

## 2020-11-28 NOTE — Telephone Encounter (Signed)
I have increased her dose of glipizide until insurance picks up. Please make sure they have picked it up. This was last week on 1/7. Note in progress area for order.

## 2020-12-01 ENCOUNTER — Telehealth (INDEPENDENT_AMBULATORY_CARE_PROVIDER_SITE_OTHER): Payer: Medicare Other | Admitting: Nurse Practitioner

## 2020-12-01 ENCOUNTER — Other Ambulatory Visit: Payer: Self-pay

## 2020-12-01 ENCOUNTER — Encounter: Payer: Self-pay | Admitting: Nurse Practitioner

## 2020-12-01 DIAGNOSIS — Z0000 Encounter for general adult medical examination without abnormal findings: Secondary | ICD-10-CM | POA: Diagnosis not present

## 2020-12-01 NOTE — Patient Instructions (Signed)
Ms. Tracy Salas , Thank you for taking time to come for your Medicare Wellness Visit. I appreciate your ongoing commitment to your health goals. Please review the following plan we discussed and let me know if I can assist you in the future.   Screening recommendations/referrals: Colonoscopy: Complted; DUE 05/25/26 Mammogram: DUE  Bone Density: DUE  Recommended yearly ophthalmology/optometry visit for glaucoma screening and checkup Recommended yearly dental visit for hygiene and checkup  Vaccinations: Influenza vaccine: Complete Pneumococcal vaccine: DUE Tdap vaccine: Complete; Due 01/01/25 Shingles vaccine: DUE    Advanced directives: N/A   Conditions/risks identified: None   Next appointment: 12/17/20 @ 4:00pm with Tereasa Coop, NP    Preventive Care 65 Years and Older, Female Preventive care refers to lifestyle choices and visits with your health care provider that can promote health and wellness. What does preventive care include?  A yearly physical exam. This is also called an annual well check.  Dental exams once or twice a year.  Routine eye exams. Ask your health care provider how often you should have your eyes checked.  Personal lifestyle choices, including:  Daily care of your teeth and gums.  Regular physical activity.  Eating a healthy diet.  Avoiding tobacco and drug use.  Limiting alcohol use.  Practicing safe sex.  Taking low-dose aspirin every day.  Taking vitamin and mineral supplements as recommended by your health care provider. What happens during an annual well check? The services and screenings done by your health care provider during your annual well check will depend on your age, overall health, lifestyle risk factors, and family history of disease. Counseling  Your health care provider may ask you questions about your:  Alcohol use.  Tobacco use.  Drug use.  Emotional well-being.  Home and relationship well-being.  Sexual  activity.  Eating habits.  History of falls.  Memory and ability to understand (cognition).  Work and work Astronomer.  Reproductive health. Screening  You may have the following tests or measurements:  Height, weight, and BMI.  Blood pressure.  Lipid and cholesterol levels. These may be checked every 5 years, or more frequently if you are over 61 years old.  Skin check.  Lung cancer screening. You may have this screening every year starting at age 53 if you have a 30-pack-year history of smoking and currently smoke or have quit within the past 15 years.  Fecal occult blood test (FOBT) of the stool. You may have this test every year starting at age 23.  Flexible sigmoidoscopy or colonoscopy. You may have a sigmoidoscopy every 5 years or a colonoscopy every 10 years starting at age 68.  Hepatitis C blood test.  Hepatitis B blood test.  Sexually transmitted disease (STD) testing.  Diabetes screening. This is done by checking your blood sugar (glucose) after you have not eaten for a while (fasting). You may have this done every 1-3 years.  Bone density scan. This is done to screen for osteoporosis. You may have this done starting at age 40.  Mammogram. This may be done every 1-2 years. Talk to your health care provider about how often you should have regular mammograms. Talk with your health care provider about your test results, treatment options, and if necessary, the need for more tests. Vaccines  Your health care provider may recommend certain vaccines, such as:  Influenza vaccine. This is recommended every year.  Tetanus, diphtheria, and acellular pertussis (Tdap, Td) vaccine. You may need a Td booster every 10 years.  Zoster  vaccine. You may need this after age 19.  Pneumococcal 13-valent conjugate (PCV13) vaccine. One dose is recommended after age 38.  Pneumococcal polysaccharide (PPSV23) vaccine. One dose is recommended after age 98. Talk to your health care  provider about which screenings and vaccines you need and how often you need them. This information is not intended to replace advice given to you by your health care provider. Make sure you discuss any questions you have with your health care provider. Document Released: 11/28/2015 Document Revised: 07/21/2016 Document Reviewed: 09/02/2015 Elsevier Interactive Patient Education  2017 [place] Prevention in the Home Falls can cause injuries. They can happen to people of all ages. There are many things you can do to make your home safe and to help prevent falls. What can I do on the outside of my home?  Regularly fix the edges of walkways and driveways and fix any cracks.  Remove anything that might make you trip as you walk through a door, such as a raised step or threshold.  Trim any bushes or trees on the path to your home.  Use bright outdoor lighting.  Clear any walking paths of anything that might make someone trip, such as rocks or tools.  Regularly check to see if handrails are loose or broken. Make sure that both sides of any steps have handrails.  Any raised decks and porches should have guardrails on the edges.  Have any leaves, snow, or ice cleared regularly.  Use sand or salt on walking paths during winter.  Clean up any spills in your garage right away. This includes oil or grease spills. What can I do in the bathroom?  Use night lights.  Install grab bars by the toilet and in the tub and shower. Do not use towel bars as grab bars.  Use non-skid mats or decals in the tub or shower.  If you need to sit down in the shower, use a plastic, non-slip stool.  Keep the floor dry. Clean up any water that spills on the floor as soon as it happens.  Remove soap buildup in the tub or shower regularly.  Attach bath mats securely with double-sided non-slip rug tape.  Do not have throw rugs and other things on the floor that can make you trip. What can I do in  the bedroom?  Use night lights.  Make sure that you have a light by your bed that is easy to reach.  Do not use any sheets or blankets that are too big for your bed. They should not hang down onto the floor.  Have a firm chair that has side arms. You can use this for support while you get dressed.  Do not have throw rugs and other things on the floor that can make you trip. What can I do in the kitchen?  Clean up any spills right away.  Avoid walking on wet floors.  Keep items that you use a lot in easy-to-reach places.  If you need to reach something above you, use a strong step stool that has a grab bar.  Keep electrical cords out of the way.  Do not use floor polish or wax that makes floors slippery. If you must use wax, use non-skid floor wax.  Do not have throw rugs and other things on the floor that can make you trip. What can I do with my stairs?  Do not leave any items on the stairs.  Make sure that there are handrails  on both sides of the stairs and use them. Fix handrails that are broken or loose. Make sure that handrails are as long as the stairways.  Check any carpeting to make sure that it is firmly attached to the stairs. Fix any carpet that is loose or worn.  Avoid having throw rugs at the top or bottom of the stairs. If you do have throw rugs, attach them to the floor with carpet tape.  Make sure that you have a light switch at the top of the stairs and the bottom of the stairs. If you do not have them, ask someone to add them for you. What else can I do to help prevent falls?  Wear shoes that:  Do not have high heels.  Have rubber bottoms.  Are comfortable and fit you well.  Are closed at the toe. Do not wear sandals.  If you use a stepladder:  Make sure that it is fully opened. Do not climb a closed stepladder.  Make sure that both sides of the stepladder are locked into place.  Ask someone to hold it for you, if possible.  Clearly mark and  make sure that you can see:  Any grab bars or handrails.  First and last steps.  Where the edge of each step is.  Use tools that help you move around (mobility aids) if they are needed. These include:  Canes.  Walkers.  Scooters.  Crutches.  Turn on the lights when you go into a dark area. Replace any light bulbs as soon as they burn out.  Set up your furniture so you have a clear path. Avoid moving your furniture around.  If any of your floors are uneven, fix them.  If there are any pets around you, be aware of where they are.  Review your medicines with your doctor. Some medicines can make you feel dizzy. This can increase your chance of falling. Ask your doctor what other things that you can do to help prevent falls. This information is not intended to replace advice given to you by your health care provider. Make sure you discuss any questions you have with your health care provider. Document Released: 08/28/2009 Document Revised: 04/08/2016 Document Reviewed: 12/06/2014 Elsevier Interactive Patient Education  2017 [employer].

## 2020-12-01 NOTE — Progress Notes (Signed)
Subjective:   Tracy Salas is a 70 y.o. female who presents for Medicare Annual (Subsequent) preventive examination.        Objective:    There were no vitals filed for this visit. There is no height or weight on file to calculate BMI.  Advanced Directives 11/29/2018  Does Patient Have a Medical Advance Directive? No    Current Medications (verified) Outpatient Encounter Medications as of 12/01/2020  Medication Sig  . blood glucose meter kit and supplies Dispense based on patient and insurance preference. Use once daily in morning before eating. (FOR ICD-10 E10.9, E11.9).  . clotrimazole-betamethasone (LOTRISONE) cream Apply 1 application topically 2 (two) times daily.  [name] diltiazem (CARDIZEM) 30 MG tablet Take 1 tablet (30 mg total) by mouth daily for 14 days.  . Dulaglutide (TRULICITY) 2.97 LG/9.2JJ SOPN Inject 0.75 mg into the skin once a week.  [name] glipiZIDE (GLUCOTROL) 10 MG tablet Take 0.5 tablets (5 mg total) by mouth 2 (two) times daily before a meal.  . lisinopril-hydrochlorothiazide (ZESTORETIC) 10-12.5 MG tablet Take 1 tablet by mouth daily.  . meclizine (ANTIVERT) 12.5 MG tablet Take 1 tablet (12.5 mg total) by mouth daily.  [name] nystatin (MYCOSTATIN/NYSTOP) powder Apply 1 application topically daily as needed.  . vitamin C (ASCORBIC ACID) 500 MG tablet Take 500 mg by mouth daily.  . Vitamin D, Ergocalciferol, (DRISDOL) 1.25 MG (50000 UT) CAPS capsule Take 50,000 Units by mouth once a week.   No facility-administered encounter medications on file as of 12/01/2020.    Allergies (verified) Penicillins and Metformin and related   History: Past Medical History:  Diagnosis Date  . Dermatitis, unspecified 03/10/2020  . Diabetes mellitus without complication ([place])   . Hypertension   . Vitamin D deficiency    Past Surgical History:  Procedure Laterality Date  . CHOLECYSTECTOMY    . TUMOR REMOVAL  2018   Family History  Problem Relation Age of Onset  .  Alzheimer's disease Mother   . Stroke Father    Social History   Socioeconomic History  . Marital status: Widowed    Spouse name: Not on file  . Number of children: 1  . Years of education: Not on file  . Highest education level: Not on file  Occupational History  . Not on file  Tobacco Use  . Smoking status: Never Smoker  . Smokeless tobacco: Never Used  Substance and Sexual Activity  . Alcohol use: Never  . Drug use: Never  . Sexual activity: Not Currently  Other Topics Concern  . Not on file  Social History Narrative   Lives with son- Eulas Post       Dogs, Cats, Goat, Snake       Enjoy: tv- stories; HGTV -house flipping       Diet: eats all food groups   Caffeine: 1 cup- daily if that    Water: 6-8 cups daily       Wears seat belt   Does not use phone while driving    Smoke Medical illustrator- safe     Social Determinants of Health   Financial Resource Strain: Low Risk   . Difficulty of Paying Living Expenses: Not hard at all  Food Insecurity: No Food Insecurity  . Worried About Charity fundraiser in the Last Year: Never true  . Ran Out of Food in the Last Year: Never true  Transportation Needs: No Transportation Needs  . Lack of  Transportation (Medical): No  . Lack of Transportation (Non-Medical): No  Physical Activity: Inactive  . Days of Exercise per Week: 0 days  . Minutes of Exercise per Session: 0 min  Stress: No Stress Concern Present  . Feeling of Stress : Not at all  Social Connections: Socially Isolated  . Frequency of Communication with Friends and Family: More than three times a week  . Frequency of Social Gatherings with Friends and Family: Twice a week  . Attends Religious Services: Never  . Active Member of Clubs or Organizations: No  . Attends Archivist Meetings: Never  . Marital Status: Widowed    Tobacco Counseling Counseling given: Not Answered                   Diabetic? Yes           Activities of Daily Living No flowsheet data found.  Patient Care Team: Perlie Mayo, NP as PCP - General (Family Medicine)  Indicate any recent Medical Services you may have received from other than [facility] providers in the past year (date may be approximate).     Assessment:   This is a routine wellness examination for [place].  Hearing/Vision screen No exam data present  Dietary issues and exercise activities discussed:    Goals   None    Depression Screen PHQ 2/9 Scores 11/05/2020 10/21/2020 10/07/2020 09/23/2020 09/23/2020 03/10/2020 10/08/2019  PHQ - 2 Score 0 0 0 0 0 0 0    Fall Risk Fall Risk  11/05/2020 10/21/2020 10/07/2020 09/23/2020 03/10/2020  Falls in the past year? 0 0 0 0 0  Comment - - - - -  Number falls in past yr: 0 0 0 0 0  Injury with Fall? 0 0 0 0 0  Risk for fall due to : No Fall Risks No Fall Risks No Fall Risks No Fall Risks -  Follow up Falls evaluation completed Falls evaluation completed Falls evaluation completed Falls evaluation completed Falls evaluation completed    FALL RISK PREVENTION PERTAINING TO THE HOME:  Any stairs in or around the home? No  If so, are there any without handrails? No  Home free of loose throw rugs in walkways, pet beds, electrical cords, etc? Yes  Adequate lighting in your home to reduce risk of falls? Yes   ASSISTIVE DEVICES UTILIZED TO PREVENT FALLS:  Life alert? no Use of a cane, walker or w/c? Yes  Grab bars in the bathroom? No  Shower chair or bench in shower? No  Elevated toilet seat or a handicapped toilet? No   TIMED UP AND GO:  Was the test performed? No .      Cognitive Function:        Immunizations Immunization History  Administered Date(s) Administered  . Fluad Quad(high Dose 65+) 09/09/2020  . Influenza-Unspecified 08/22/2017, 07/21/2018, 08/08/2019  . Moderna Sars-Covid-2 Vaccination 02/08/2020, 09/15/2020    TDAP status: Due, Education has been provided regarding the  importance of this vaccine. Advised may receive this vaccine at local pharmacy or Health Dept. Aware to provide a copy of the vaccination record if obtained from local pharmacy or Health Dept. Verbalized acceptance and understanding.  Flu Vaccine status: Up to date  Pneumococcal vaccine status: Due, Education has been provided regarding the importance of this vaccine. Advised may receive this vaccine at local pharmacy or Health Dept. Aware to provide a copy of the vaccination record if obtained from local pharmacy or Health Dept. Verbalized acceptance and understanding.  Covid-19 vaccine status: Completed vaccines  Qualifies for Shingles Vaccine? Yes   Zostavax completed No   Shingrix Completed?: No.    Education has been provided regarding the importance of this vaccine. Patient has been advised to call insurance company to determine out of pocket expense if they have not yet received this vaccine. Advised may also receive vaccine at local pharmacy or Health Dept. Verbalized acceptance and understanding.  Screening Tests Health Maintenance  Topic Date Due  . FOOT EXAM  Never done  . OPHTHALMOLOGY EXAM  Never done  . DEXA SCAN  Never done  . PNA vac Low Risk Adult (1 of 2 - PCV13) Never done  . MAMMOGRAM  11/15/2020  . Hepatitis C Screening  12/29/2020 (Originally Jan 28, 1951)  . COVID-19 Vaccine (3 - Booster for Moderna series) 03/15/2021  . HEMOGLOBIN A1C  03/24/2021  . TETANUS/TDAP  01/01/2025  . COLONOSCOPY (Pts 45-33yr Insurance coverage will need to be confirmed)  05/25/2026  . INFLUENZA VACCINE  Completed    Health Maintenance  Health Maintenance Due  Topic Date Due  . FOOT EXAM  Never done  . OPHTHALMOLOGY EXAM  Never done  . DEXA SCAN  Never done  . PNA vac Low Risk Adult (1 of 2 - PCV13) Never done  . MAMMOGRAM  11/15/2020    Colorectal cancer screening: Type of screening: Colonoscopy. Completed normal. Repeat every 5 years  Mammogram: Due, pt declines at this time.    Bone Density: Never done; due   Lung Cancer Screening: (Low Dose CT Chest recommended if Age 70-80years, 30 pack-year currently smoking OR have quit w/in 15years.) does not qualify.    Additional Screening:  Hepatitis C Screening: does qualify; not done.   Vision Screening: Recommended annual ophthalmology exams for early detection of glaucoma and other disorders of the eye. Is the patient up to date with their annual eye exam?  Yes  Who is the provider or what is the name of the office in which the patient attends annual eye exams? Pt is currently due for eye exam, [place] If pt is not established with a provider, would they like to be referred to a provider to establish care? No .   Dental Screening: Recommended annual dental exams for proper oral hygiene  Community Resource Referral / Chronic Care Management: CRR required this visit?  No   CCM required this visit?  No      Plan:     I have personally reviewed and noted the following in the patient's chart:   . Medical and social history . Use of alcohol, tobacco or illicit drugs  . Current medications and supplements . Functional ability and status . Nutritional status . Physical activity . Advanced directives . List of other physicians . Hospitalizations, surgeries, and ER visits in previous 12 months . Vitals . Screenings to include cognitive, depression, and falls . Referrals and appointments  In addition, I have reviewed and discussed with patient certain preventive protocols, quality metrics, and best practice recommendations. A written personalized care plan for preventive services as well as general preventive health recommendations were provided to patient.     ALonn Georgia LPN   11/93/[id number]  Nurse Notes: AWV conducted over the phone with pt consent to televisit via audio. Pt at the home at the time of call and provider off site at the time. Phone call took approx. 20 min  to finish. Pt plans to close some health maintenance  care gaps at next visit.

## 2020-12-03 ENCOUNTER — Telehealth: Payer: Medicare Other | Admitting: Nurse Practitioner

## 2020-12-16 ENCOUNTER — Other Ambulatory Visit: Payer: Self-pay | Admitting: Family Medicine

## 2020-12-16 DIAGNOSIS — I10 Essential (primary) hypertension: Secondary | ICD-10-CM

## 2020-12-17 ENCOUNTER — Ambulatory Visit: Payer: Medicare Other | Admitting: Family Medicine

## 2020-12-24 ENCOUNTER — Ambulatory Visit: Payer: Medicare Other | Admitting: Nurse Practitioner

## 2020-12-27 ENCOUNTER — Other Ambulatory Visit: Payer: Self-pay | Admitting: Family Medicine

## 2020-12-29 ENCOUNTER — Other Ambulatory Visit: Payer: Self-pay | Admitting: Family Medicine

## 2020-12-29 NOTE — Telephone Encounter (Signed)
Can you clarify how often pt is to check her blood sugar?

## 2020-12-30 ENCOUNTER — Other Ambulatory Visit: Payer: Self-pay

## 2020-12-30 DIAGNOSIS — E1165 Type 2 diabetes mellitus with hyperglycemia: Secondary | ICD-10-CM

## 2020-12-30 MED ORDER — ACCU-CHEK GUIDE VI STRP: ORAL_STRIP | 3 refills | Status: AC

## 2020-12-30 NOTE — Telephone Encounter (Signed)
She is once daily right now. Morning fasting check is desired.

## 2021-02-04 ENCOUNTER — Ambulatory Visit: Payer: Medicare Other | Admitting: Nurse Practitioner

## 2021-02-07 ENCOUNTER — Other Ambulatory Visit: Payer: Self-pay | Admitting: Family Medicine

## 2021-02-07 DIAGNOSIS — I10 Essential (primary) hypertension: Secondary | ICD-10-CM

## 2021-03-05 LAB — HM DIABETES EYE EXAM

## 2021-03-07 ENCOUNTER — Other Ambulatory Visit: Payer: Self-pay | Admitting: Family Medicine

## 2021-03-07 DIAGNOSIS — I10 Essential (primary) hypertension: Secondary | ICD-10-CM

## 2021-03-13 ENCOUNTER — Other Ambulatory Visit: Payer: Self-pay | Admitting: Family Medicine

## 2021-03-30 ENCOUNTER — Ambulatory Visit: Payer: Medicare Other | Admitting: Nurse Practitioner

## 2021-04-08 ENCOUNTER — Ambulatory Visit: Payer: Medicare Other | Admitting: Nurse Practitioner

## 2021-04-09 ENCOUNTER — Other Ambulatory Visit: Payer: Self-pay | Admitting: Family Medicine

## 2021-04-09 DIAGNOSIS — I10 Essential (primary) hypertension: Secondary | ICD-10-CM

## 2021-05-02 ENCOUNTER — Other Ambulatory Visit: Payer: Self-pay | Admitting: Family Medicine

## 2021-05-02 DIAGNOSIS — I10 Essential (primary) hypertension: Secondary | ICD-10-CM

## 2021-05-29 ENCOUNTER — Other Ambulatory Visit: Payer: Self-pay | Admitting: Family Medicine

## 2021-05-29 DIAGNOSIS — I10 Essential (primary) hypertension: Secondary | ICD-10-CM

## 2021-06-12 ENCOUNTER — Other Ambulatory Visit: Payer: Self-pay | Admitting: Nurse Practitioner

## 2021-06-25 ENCOUNTER — Other Ambulatory Visit: Payer: Self-pay | Admitting: Family Medicine

## 2021-06-25 DIAGNOSIS — I10 Essential (primary) hypertension: Secondary | ICD-10-CM

## 2021-07-01 ENCOUNTER — Telehealth (INDEPENDENT_AMBULATORY_CARE_PROVIDER_SITE_OTHER): Payer: Medicare Other | Admitting: Nurse Practitioner

## 2021-07-01 ENCOUNTER — Other Ambulatory Visit: Payer: Self-pay | Admitting: Nurse Practitioner

## 2021-07-01 ENCOUNTER — Ambulatory Visit: Payer: Medicare Other | Admitting: Family Medicine

## 2021-07-01 ENCOUNTER — Encounter: Payer: Self-pay | Admitting: Nurse Practitioner

## 2021-07-01 VITALS — Ht 62.0 in | Wt 219.0 lb

## 2021-07-01 DIAGNOSIS — L989 Disorder of the skin and subcutaneous tissue, unspecified: Secondary | ICD-10-CM

## 2021-07-01 DIAGNOSIS — E1165 Type 2 diabetes mellitus with hyperglycemia: Secondary | ICD-10-CM | POA: Diagnosis not present

## 2021-07-01 MED ORDER — MUPIROCIN CALCIUM 2 % EX CREA: 1.0000 "application " | TOPICAL_CREAM | Freq: Two times a day (BID) | CUTANEOUS | 0 refills | Status: DC

## 2021-07-01 MED ORDER — KETOCONAZOLE 2 % EX CREA: 1.0000 "application " | TOPICAL_CREAM | Freq: Every day | CUTANEOUS | 0 refills | Status: DC

## 2021-07-01 NOTE — Patient Instructions (Signed)
Please have fasting labs drawn 2-3 days prior to your appointment so we can discuss the results during your office visit.  

## 2021-07-01 NOTE — Progress Notes (Signed)
Acute Office Visit  Subjective:    Patient ID: Tracy Salas, female    DOB: November 14, 1951, 70 y.o.   MRN: [id number]  Chief Complaint  Patient presents with   Diabetes    Follow up    Diabetes  Patient is in today for follow-up for diabetes. She was supposed to f/u in 6-8 weeks after her last appointment in Dec 2021. She is no longer taking trulicity.  She states that she has skin inflammation and wound to her navel. Denies abscess.  She states blood sugars are up and down, and she doesn't keep a record of her blood sugars.  Lab Results  Component Value Date   HGBA1C 8.5 (A) 09/24/2020     Past Medical History:  Diagnosis Date   Dermatitis, unspecified 03/10/2020   Diabetes mellitus without complication ([place])    Hypertension    Vitamin D deficiency     Past Surgical History:  Procedure Laterality Date   CHOLECYSTECTOMY     TUMOR REMOVAL  2018    Family History  Problem Relation Age of Onset   Alzheimer's disease Mother    Stroke Father     Social History   Socioeconomic History   Marital status: Widowed    Spouse name: Not on file   Number of children: 1   Years of education: Not on file   Highest education level: Not on file  Occupational History   Not on file  Tobacco Use   Smoking status: Never   Smokeless tobacco: Never  Substance and Sexual Activity   Alcohol use: Never   Drug use: Never   Sexual activity: Not Currently  Other Topics Concern   Not on file  Social History Narrative   Lives with son- Eulas Post       Dogs, Cats, Goat, Snake       Enjoy: tv- stories; HGTV -house flipping       Diet: eats all food groups   Caffeine: 1 cup- daily if that    Water: 6-8 cups daily       Wears seat belt   Does not use phone while driving    Smoke Medical illustrator- safe     Social Determinants of Health   Financial Resource Strain: Low Risk    Difficulty of Paying Living Expenses: Not hard at all  Food Insecurity:  No Food Insecurity   Worried About Charity fundraiser in the Last Year: Never true   Arboriculturist in the Last Year: Never true  Transportation Needs: No Transportation Needs   Lack of Transportation (Medical): No   Lack of Transportation (Non-Medical): No  Physical Activity: Inactive   Days of Exercise per Week: 0 days   Minutes of Exercise per Session: 0 min  Stress: No Stress Concern Present   Feeling of Stress : Not at all  Social Connections: Moderately Isolated   Frequency of Communication with Friends and Family: More than three times a week   Frequency of Social Gatherings with Friends and Family: Twice a week   Attends Religious Services: Never   Marine scientist or Organizations: No   Attends Music therapist: Never   Marital Status: Married  Human resources officer Violence: Not At Risk   Fear of Current or Ex-Partner: No   Emotionally Abused: No   Physically Abused: No   Sexually Abused: No    Outpatient Medications Prior to Visit  Medication  Sig Dispense Refill   ACCU-CHEK GUIDE test strip USE AS DIRECTED 100 strip 3   blood glucose meter kit and supplies Dispense based on patient and insurance preference. Use once daily in morning before eating. (FOR ICD-10 E10.9, E11.9). 1 each 0   clotrimazole-betamethasone (LOTRISONE) cream Apply 1 application topically 2 (two) times daily. 30 g 3   glipiZIDE (GLUCOTROL) 10 MG tablet TAKE 0.5 TABLETS (5 MG TOTAL) BY MOUTH 2 (TWO) TIMES DAILY BEFORE A MEAL. 90 tablet 1   glucose blood (ACCU-CHEK GUIDE) test strip Check once daily, morning fasting check is desired. 100 strip 3   lisinopril-hydrochlorothiazide (ZESTORETIC) 10-12.5 MG tablet TAKE 1 TABLET BY MOUTH EVERY DAY 30 tablet 1   meclizine (ANTIVERT) 12.5 MG tablet Take 1 tablet (12.5 mg total) by mouth daily. 15 tablet 0   nystatin (MYCOSTATIN/NYSTOP) powder Apply 1 application topically daily as needed.     vitamin C (ASCORBIC ACID) 500 MG tablet Take 500 mg by  mouth daily.     Vitamin D, Ergocalciferol, (DRISDOL) 1.25 MG (50000 UT) CAPS capsule Take 50,000 Units by mouth once a week.     diltiazem (CARDIZEM) 30 MG tablet Take 1 tablet (30 mg total) by mouth daily for 14 days. 14 tablet 0   Dulaglutide (TRULICITY) 4.16 LA/4.5XM SOPN Inject 0.75 mg into the skin once a week. (Patient not taking: Reported on 07/01/2021) 4 mL 1   No facility-administered medications prior to visit.    Allergies  Allergen Reactions   Penicillins Shortness Of Breath and Rash    Did it involve swelling of the face/tongue/throat, SOB, or low BP? Yes Did it involve sudden or severe rash/hives, skin peeling, or any reaction on the inside of your mouth or nose? Yes Did you need to seek medical attention at a hospital or doctor's office? Unknown When did it last happen? over 29 years--70 years old      If all above answers are "NO", may proceed with cephalosporin use.    Metformin And Related Itching and Other (See Comments)    Shaking, altered mental status    Review of Systems  Constitutional: Negative.   Endocrine:       Labile blood sugar  Skin:  Positive for wound.       To umbilicus      Objective:    Physical Exam  Ht _0  (1.575 m)   Wt 219 lb (99.3 kg)   BMI 40.06 kg/m  Wt Readings from Last 3 Encounters:  07/01/21 219 lb (99.3 kg)  11/05/20 223 lb (101.2 kg)  10/21/20 220 lb (99.8 kg)    Health Maintenance Due  Topic Date Due   FOOT EXAM  Never done   OPHTHALMOLOGY EXAM  Never done   Hepatitis C Screening  Never done   Zoster Vaccines- Shingrix (1 of 2) Never done   DEXA SCAN  Never done   PNA vac Low Risk Adult (1 of 2 - PCV13) Never done   MAMMOGRAM  11/15/2020   COVID-19 Vaccine (4 - Booster for Moderna series) 01/13/2021   HEMOGLOBIN A1C  03/24/2021   INFLUENZA VACCINE  06/15/2021    There are no preventive care reminders to display for this patient.   Lab Results  Component Value Date   TSH 1.130 11/29/2018   Lab Results   Component Value Date   WBC 8.0 11/29/2018   HGB 16.3 (H) 11/29/2018   HCT 51.4 (H) 11/29/2018   MCV 89.2 11/29/2018   PLT 234  11/29/2018   Lab Results  Component Value Date   NA 139 08/03/2019   K 4.3 08/03/2019   CO2 26 (A) 08/03/2019   GLUCOSE 196 (H) 11/29/2018   BUN 19 08/03/2019   CREATININE 0.9 08/03/2019   ALKPHOS 88 08/03/2019   AST 16 08/03/2019   ALT 13 08/03/2019   ALBUMIN 4.0 08/03/2019   CALCIUM 9.3 11/29/2018   ANIONGAP 11 11/29/2018   Lab Results  Component Value Date   CHOL 162 08/03/2019   Lab Results  Component Value Date   HDL 47 08/03/2019   Lab Results  Component Value Date   LDLCALC 100 08/03/2019   Lab Results  Component Value Date   TRIG 67 08/03/2019   No results found for: CHOLHDL Lab Results  Component Value Date   HGBA1C 8.5 (A) 09/24/2020       Assessment & Plan:   Problem List Items Addressed This Visit       Endocrine   Type 2 diabetes mellitus with hyperglycemia ([place])    -no longer on trulicity Lab Results  Component Value Date   HGBA1C 8.5 (A) 09/24/2020   -she will have fasting labs drawn      Relevant Orders   CBC with Differential/Platelet   CMP14+EGFR   Lipid Panel With LDL/HDL Ratio   Hemoglobin A1c     Musculoskeletal and Integument   Skin lesion - Primary    -pt states she has a large belly and has a superficial wound to her umbilicus -unable to assess over telephone -Rx. Ketoconazole and mupirocin -f/u in 1 week      Relevant Medications   ketoconazole (NIZORAL) 2 % cream   mupirocin cream (BACTROBAN) 2 %     Meds ordered this encounter  Medications   ketoconazole (NIZORAL) 2 % cream    Sig: Apply 1 application topically daily.    Dispense:  15 g    Refill:  0   mupirocin cream (BACTROBAN) 2 %    Sig: Apply 1 application topically 2 (two) times daily.    Dispense:  15 g    Refill:  0    Date:  07/01/2021   Location of Patient: Home Location of Provider: Office Consent was obtain  for visit to be over via telehealth. I verified that I am speaking with the correct person using two identifiers.  I connected with  Odie Sera on 07/01/21 via telephone and verified that I am speaking with the correct person using two identifiers.   I discussed the limitations of evaluation and management by telemedicine. The patient expressed understanding and agreed to proceed.  Time spent: 7 minutes   Noreene Larsson, NP

## 2021-07-01 NOTE — Assessment & Plan Note (Signed)
-  no longer on trulicity Lab Results  Component Value Date   HGBA1C 8.5 (A) 09/24/2020   -she will have fasting labs drawn

## 2021-07-01 NOTE — Assessment & Plan Note (Signed)
-  pt states she has a large belly and has a superficial wound to her umbilicus -unable to assess over telephone -Rx. Ketoconazole and mupirocin -f/u in 1 week

## 2021-07-17 ENCOUNTER — Other Ambulatory Visit: Payer: Self-pay | Admitting: Family Medicine

## 2021-07-17 ENCOUNTER — Ambulatory Visit: Payer: Medicare Other | Admitting: Nurse Practitioner

## 2021-07-17 DIAGNOSIS — I10 Essential (primary) hypertension: Secondary | ICD-10-CM

## 2021-08-11 ENCOUNTER — Other Ambulatory Visit: Payer: Self-pay

## 2021-08-11 ENCOUNTER — Other Ambulatory Visit: Payer: Self-pay | Admitting: *Deleted

## 2021-08-11 DIAGNOSIS — I10 Essential (primary) hypertension: Secondary | ICD-10-CM

## 2021-08-11 MED ORDER — LISINOPRIL-HYDROCHLOROTHIAZIDE 10-12.5 MG PO TABS
1.0000 | ORAL_TABLET | Freq: Every day | ORAL | 1 refills | Status: DC
Start: 2021-08-11 — End: 2021-08-13

## 2021-08-11 MED ORDER — LISINOPRIL-HYDROCHLOROTHIAZIDE 10-12.5 MG PO TABS: 1.0000 | ORAL_TABLET | Freq: Every day | ORAL | 1 refills | Status: DC

## 2021-08-13 ENCOUNTER — Telehealth: Payer: Self-pay | Admitting: Nurse Practitioner

## 2021-08-13 ENCOUNTER — Other Ambulatory Visit: Payer: Self-pay

## 2021-08-13 DIAGNOSIS — I10 Essential (primary) hypertension: Secondary | ICD-10-CM

## 2021-08-13 MED ORDER — GLIPIZIDE 10 MG PO TABS: 5.0000 mg | ORAL_TABLET | Freq: Two times a day (BID) | ORAL | 1 refills | Status: DC

## 2021-08-13 MED ORDER — LISINOPRIL-HYDROCHLOROTHIAZIDE 10-12.5 MG PO TABS: 1.0000 | ORAL_TABLET | Freq: Every day | ORAL | 1 refills | Status: DC

## 2021-08-13 NOTE — Telephone Encounter (Signed)
Patient called in about prescription refills for Lisinopril and Glipizide to be send to [facility] in [place]

## 2021-08-13 NOTE — Telephone Encounter (Signed)
Refills sent

## 2021-10-07 DIAGNOSIS — Z9189 Other specified personal risk factors, not elsewhere classified: Secondary | ICD-10-CM

## 2021-10-07 NOTE — Progress Notes (Signed)
[facility])                                            [facility] Quality Pharmacy Team                                        Statin Quality Measure Assessment    10/07/2021  ANALISE GLOTFELTY 1951-11-07 [id number]  Bjorn Pippin, NP,   I am a [facility] clinical pharmacist that reviews patients for statin quality initiatives.     Per review of chart and payor information, patient has a diagnosis of diabetes but is not currently filling a statin prescription.  This places patient into the SUPD (Statin Use In Patients with Diabetes) measure for CMS.    I could not find any documentation of previous trial of a statin or a history of statin intolerance.   The 10-year ASCVD risk score (Arnett DK, et al., 2019) is: 31.3%*   Values used to calculate the score:     Age: 70 years     Sex: Female     Is Non-Hispanic African American: No     Diabetic: Yes     Tobacco smoker: No     Systolic Blood Pressure: 156 mmHg     Is BP treated: Yes     HDL Cholesterol: 47 mg/dL*     Total Cholesterol: 162 mg/dL*     * - Cholesterol units were assumed for this score calculation 08/03/2019     Component Value Date/Time   CHOL 162 08/03/2019 0000   TRIG 67 08/03/2019 0000   HDL 47 08/03/2019 0000   LDLCALC 100 08/03/2019 0000    Please consider ONE of the following recommendations:  Initiate high intensity statin Atorvastatin 40mg  once daily, #90, 3 refills   Rosuvastatin 20mg  once daily, #90, 3 refills    Initiate moderate intensity          statin with reduced frequency if prior          statin intolerance 1x weekly, #13, 3 refills   2x weekly, #26, 3 refills   3x weekly, #39, 3 refills    Code for past statin intolerance or  other exclusions (required annually)  Provider Requirements: Associate code during an office visit or telehealth encounter  Drug Induced Myopathy G72.0   Myopathy, unspecified G72.9   Myositis, unspecified M60.9    Rhabdomyolysis M62.82   Alcoholic fatty liver K70.0   Cirrhosis of liver K74.69   Prediabetes R73.03   PCOS E28.2   Toxic liver disease, unspecified K71.9   Adverse effect of antihyperlipidemic and antiarteriosclerotic drugs, initial encounter T46.6X5A    Please let know your decision.    Thank you!   , PharmD Clinical Pharmacist  [facility] [phone]

## 2021-10-09 ENCOUNTER — Ambulatory Visit: Payer: Medicare Other | Admitting: Nurse Practitioner

## 2021-11-11 ENCOUNTER — Telehealth: Payer: Self-pay | Admitting: Nurse Practitioner

## 2021-11-11 ENCOUNTER — Other Ambulatory Visit: Payer: Self-pay

## 2021-11-11 DIAGNOSIS — I10 Essential (primary) hypertension: Secondary | ICD-10-CM

## 2021-11-11 MED ORDER — LISINOPRIL-HYDROCHLOROTHIAZIDE 10-12.5 MG PO TABS: 1.0000 | ORAL_TABLET | Freq: Every day | ORAL | 1 refills | Status: DC

## 2021-11-11 NOTE — Telephone Encounter (Signed)
Please send medication to [facility] in [place]   Lisinopril -hctz

## 2021-11-11 NOTE — Telephone Encounter (Signed)
Refill sent.

## 2021-12-02 ENCOUNTER — Ambulatory Visit: Payer: Medicare Other | Admitting: Family Medicine

## 2021-12-03 ENCOUNTER — Ambulatory Visit (INDEPENDENT_AMBULATORY_CARE_PROVIDER_SITE_OTHER): Payer: Medicare Other

## 2021-12-03 ENCOUNTER — Other Ambulatory Visit: Payer: Self-pay

## 2021-12-03 DIAGNOSIS — Z0000 Encounter for general adult medical examination without abnormal findings: Secondary | ICD-10-CM | POA: Diagnosis not present

## 2021-12-03 NOTE — Progress Notes (Signed)
Subjective:   Tracy Salas is a 71 y.o. female who presents for Medicare Annual (Subsequent) preventive examination. I connected with  Odie Sera on 12/03/21 by a audio enabled telemedicine application and verified that I am speaking with the correct person using two identifiers.  Patient Location: Home  Provider Location: Office/Clinic  I discussed the limitations of evaluation and management by telemedicine. The patient expressed understanding and agreed to proceed.  Review of Systems    Defer to PCP Cardiac Risk Factors include: advanced age (>62mn, >>20women);diabetes mellitus;hypertension;obesity (BMI >30kg/m2);sedentary lifestyle     Objective:    Today's Vitals   12/03/21 1357  PainSc: 9    There is no height or weight on file to calculate BMI.  Advanced Directives 12/03/2021 12/01/2020 11/29/2018  Does Patient Have a Medical Advance Directive? No No No  Would patient like information on creating a medical advance directive? Yes (ED - Information included in AVS) No - Patient declined -    Current Medications (verified) Outpatient Encounter Medications as of 12/03/2021  Medication Sig   ACCU-CHEK GUIDE test strip USE AS DIRECTED   blood glucose meter kit and supplies Dispense based on patient and insurance preference. Use once daily in morning before eating. (FOR ICD-10 E10.9, E11.9).   glipiZIDE (GLUCOTROL) 10 MG tablet Take 0.5 tablets (5 mg total) by mouth 2 (two) times daily before a meal.   glucose blood (ACCU-CHEK GUIDE) test strip Check once daily, morning fasting check is desired.   lisinopril-hydrochlorothiazide (ZESTORETIC) 10-12.5 MG tablet Take 1 tablet by mouth daily.   nystatin (MYCOSTATIN/NYSTOP) powder Apply 1 application topically daily as needed.   vitamin C (ASCORBIC ACID) 500 MG tablet Take 500 mg by mouth daily.   clotrimazole-betamethasone (LOTRISONE) cream Apply 1 application topically 2 (two) times daily. (Patient not taking: Reported  on 12/03/2021)   ketoconazole (NIZORAL) 2 % cream Apply 1 application topically daily. (Patient not taking: Reported on 12/03/2021)   meclizine (ANTIVERT) 12.5 MG tablet Take 1 tablet (12.5 mg total) by mouth daily. (Patient not taking: Reported on 12/03/2021)   mupirocin cream (BACTROBAN) 2 % Apply 1 application topically 2 (two) times daily. (Patient not taking: Reported on 12/03/2021)   Vitamin D, Ergocalciferol, (DRISDOL) 1.25 MG (50000 UT) CAPS capsule Take 50,000 Units by mouth once a week. (Patient not taking: Reported on 12/03/2021)   No facility-administered encounter medications on file as of 12/03/2021.    Allergies (verified) Penicillins and Metformin and related   History: Past Medical History:  Diagnosis Date   Dermatitis, unspecified 03/10/2020   Diabetes mellitus without complication ([facility]    Hypertension    Vitamin D deficiency    Past Surgical History:  Procedure Laterality Date   CHOLECYSTECTOMY     TUMOR REMOVAL  2018   Family History  Problem Relation Age of Onset   Alzheimer's disease Mother    Stroke Father    Social History   Socioeconomic History   Marital status: Widowed    Spouse name: Not on file   Number of children: 1   Years of education: Not on file   Highest education level: Not on file  Occupational History   Occupation: retired    Comment: Patient check in at [employer]  Tobacco Use   Smoking status: Never   Smokeless tobacco: Never  Vaping Use   Vaping Use: Never used  Substance and Sexual Activity   Alcohol use: Never   Drug use: Never   Sexual activity:  Not Currently  Other Topics Concern   Not on file  Social History Narrative   Lives with son- Eulas Post       Dogs,  [place], Snake       Enjoy: tv- stories; HGTV -house flipping       Diet: eats all food groups   Caffeine: 1 cup- daily if that    Water: 6-8 cups daily       Wears seat belt   Does not use phone while driving    [employer] Public librarian- safe     Social Determinants of Health   Financial Resource Strain: Low Risk    Difficulty of Paying Living Expenses: Not hard at all  Food Insecurity: No Food Insecurity   Worried About Charity fundraiser in the Last Year: Never true   Arboriculturist in the Last Year: Never true  Transportation Needs: No Transportation Needs   Lack of Transportation (Medical): No   Lack of Transportation (Non-Medical): No  Physical Activity: Inactive   Days of Exercise per Week: 0 days   Minutes of Exercise per Session: 0 min  Stress: No Stress Concern Present   Feeling of Stress : Not at all  Social Connections: Socially Isolated   Frequency of Communication with Friends and Family: More than three times a week   Frequency of Social Gatherings with Friends and Family: Twice a week   Attends Religious Services: Never   Marine scientist or Organizations: No   Attends Archivist Meetings: Never   Marital Status: Widowed    Tobacco Counseling Counseling given: Not Answered   Clinical Intake:  Pre-visit preparation completed: No  Pain : 0-10 Pain Score: 9  Pain Type: Chronic pain Pain Location: Other (Comment) (all over bone pain) Pain Descriptors / Indicators: Aching Pain Onset: More than a month ago Pain Frequency: Other (Comment) (When it rains it is really bad)     Diabetes: Yes CBG done?: No Did pt. bring in CBG monitor from home?: No  How often do you need to have someone help you when you read instructions, pamphlets, or other written materials from your doctor or pharmacy?: 1 - Never What is the last grade level you completed in school?: 12  Diabetic?Nutrition Risk Assessment:  Has the patient had any N/V/D within the last 2 months?  No  Does the patient have any non-healing wounds?  No  Has the patient had any unintentional weight loss or weight gain?  No   Diabetes:  Is the patient diabetic?  Yes  If diabetic, was a CBG obtained today?  No   Did the patient bring in their glucometer from home?  No  How often do you monitor your CBG's? Once daily.   Financial Strains and Diabetes Management:  Are you having any financial strains with the device, your supplies or your medication? No .  Does the patient want to be seen by Chronic Care Management for management of their diabetes?  No  Would the patient like to be referred to a Nutritionist or for Diabetic Management?  No   Diabetic Exams:  Diabetic Eye Exam: Overdue for diabetic eye exam. Pt has been advised about the importance in completing this exam. Patient advised to call and schedule an eye exam. Diabetic Foot Exam: Overdue, Pt has been advised about the importance in completing this exam. Pt is scheduled for diabetic foot exam on 12/04/2021.  Interpreter Needed?: No  Information entered by :: Judeen Hammans   Activities of Daily Living In your present state of health, do you have any difficulty performing the following activities: 12/03/2021  Hearing? N  Vision? N  Difficulty concentrating or making decisions? Y  Walking or climbing stairs? Y  Dressing or bathing? N  Doing errands, shopping? Y  Preparing Food and eating ? N  Using the Toilet? N  In the past six months, have you accidently leaked urine? Y  Do you have problems with loss of bowel control? N  Managing your Medications? N  Managing your Finances? N  Housekeeping or managing your Housekeeping? N  Some recent data might be hidden    Patient Care Team: Noreene Larsson, NP as PCP - General (Nurse Practitioner)  Indicate any recent Medical Services you may have received from other than [facility] providers in the past year (date may be approximate).     Assessment:   This is a routine wellness examination for [place].  Hearing/Vision screen No results found.  Dietary issues and exercise activities discussed: Current Exercise Habits: Home exercise routine, Type of exercise: Other - see comments, Intensity:  Mild, Exercise limited by: Other - see comments (pt states she stretches every morning)   Goals Addressed             This Visit's Progress    Glycemic Management Optimized   On track    Evidence-based guidance:   Anticipate A1C testing (point-of-care) every 3 to 6 months based on goal attainment.  Review mutually-set A1C goal or target range.  Anticipate screening for thyroid dysfunction, adrenal dysfunction and celiac disease based on presenting signs/symptoms.  Anticipate use of insulin, multidose or continuous subcutaneous infusion with periodic adjustments; consider active involvement of pharmacist.  Explore child and caregiver fear of hypoglycemic episodes that may impact adherence to treatment plan, such as withholding insulin and allowing blood sugars to be a little high.  Provide medical nutrition therapy and development of individualized eating plan.  Compare child or caregiver reported symptoms of hypo or hyperglycemia to blood glucose levels, diet and fluid intake, current medications, psychosocial and physiologic stressors, change in activity and barriers to care adherence.  Promote self-monitoring of blood glucose levels, interval or continuous.  Assess and address barriers regarding adherence to treatment and self-management plan, including patient factors, such as family cohesion, age, developmental ability, depression, anxiety, fear of hypoglycemia or weight gain, as   well as medication cost, side effects and complicated regimen.  Encourage developmentally-appropriate balance between dependence and independence in care, especially when intensifying treatment, such as continuous blood glucose monitoring or the use of an insulin pump.  Consider referral to community-based diabetes education program, visiting nurse, community health worker or health coach.  Initiate or review diabetes medical management plan for school that may include storage of insulin and clean,  private location for insulin administration or blood glucose monitoring.    Notes:       Depression Screen PHQ 2/9 Scores 12/03/2021 07/01/2021 12/01/2020 11/05/2020 10/21/2020 10/07/2020 09/23/2020  PHQ - 2 Score 0 0 0 0 0 0 0    Fall Risk Fall Risk  12/03/2021 07/01/2021 12/01/2020 11/05/2020 10/21/2020  Falls in the past year? 1 0 0 0 0  Comment - - - - -  Number falls in past yr: 0 0 0 0 0  Injury with Fall? 0 0 0 0 0  Risk for fall due to : Orthopedic patient;Impaired balance/gait No Fall Risks No  Fall Risks No Fall Risks No Fall Risks  Follow up _0     FALL RISK PREVENTION PERTAINING TO THE HOME:  Any stairs in or around the home? Yes  If so, are there any without handrails? Yes  Home free of loose throw rugs in walkways, pet beds, electrical cords, etc? Yes  Adequate lighting in your home to reduce risk of falls? Yes   ASSISTIVE DEVICES UTILIZED TO PREVENT FALLS:  Life alert? No  Use of a cane, walker or w/c? Yes  Grab bars in the bathroom? Yes  Shower chair or bench in shower? Yes  Elevated toilet seat or a handicapped toilet? No     Cognitive Function:     6CIT Screen 12/03/2021 12/01/2020 12/01/2020  What Year? 0 points 0 points 0 points  What month? 0 points 0 points 0 points  What time? 0 points 0 points 0 points  Count back from 20 0 points 0 points 0 points  Months in reverse 4 points 0 points 0 points  Repeat phrase 2 points 0 points 0 points  Total Score 6 0 0    Immunizations Immunization History  Administered Date(s) Administered   Fluad Quad(high Dose 65+) 09/09/2020   Influenza-Unspecified 08/22/2017, 07/21/2018, 08/08/2019, 09/08/2021   Moderna SARS-COV2 Booster Vaccination 10/13/2021   Moderna Sars-Covid-2 Vaccination 01/10/2020, 02/08/2020, 09/15/2020, 07/08/2021    TDAP status: Up to date  Flu Vaccine status: Up to  date  Pneumococcal vaccine status: Due, Education has been provided regarding the importance of this vaccine. Advised may receive this vaccine at local pharmacy or Health Dept. Aware to provide a copy of the vaccination record if obtained from local pharmacy or Health Dept. Verbalized acceptance and understanding.  Covid-19 vaccine status: Information provided on how to obtain vaccines.   Qualifies for Shingles Vaccine? Yes   Zostavax completed No   Shingrix Completed?: No.    Education has been provided regarding the importance of this vaccine. Patient has been advised to call insurance company to determine out of pocket expense if they have not yet received this vaccine. Advised may also receive vaccine at local pharmacy or Health Dept. Verbalized acceptance and understanding.  Screening Tests Health Maintenance  Topic Date Due   Pneumonia Vaccine 74+ Years old (1 - PCV) Never done   FOOT EXAM  Never done   OPHTHALMOLOGY EXAM  Never done   Hepatitis C Screening  Never done   Zoster Vaccines- Shingrix (1 of 2) Never done   DEXA SCAN  Never done   MAMMOGRAM  11/15/2020   HEMOGLOBIN A1C  03/24/2021   COVID-19 Vaccine (5 - Booster for Moderna series) 12/08/2021   TETANUS/TDAP  01/01/2025   COLONOSCOPY (Pts 45-60yr Insurance coverage will need to be confirmed)  05/25/2026   INFLUENZA VACCINE  Completed   HPV VACCINES  Aged Out    Health Maintenance  Health Maintenance Due  Topic Date Due   Pneumonia Vaccine 71 Years old (1 - PCV) Never done   FOOT EXAM  Never done   OPHTHALMOLOGY EXAM  Never done   Hepatitis C Screening  Never done   Zoster Vaccines- Shingrix (1 of 2) Never done   DEXA SCAN  Never done   MAMMOGRAM  11/15/2020   HEMOGLOBIN A1C  03/24/2021    Colorectal cancer screening: Type of screening: Colonoscopy. Completed 05/25/2026. Repeat every 10 years  Mammogram status: Ordered 12/03/2021. Pt provided with contact info  and advised to call to schedule appt.   Bone  Density status: Ordered 11/30/2021. Pt provided with contact info and advised to call to schedule appt.  Lung Cancer Screening: (Low Dose CT Chest recommended if Age 37-80 years, 30 pack-year currently smoking OR have quit w/in 15years.) does not qualify.   Lung Cancer Screening Referral: n/a  Additional Screening:  Hepatitis C Screening: does not qualify; Completed not high risk for hep c  Vision Screening: Recommended annual ophthalmology exams for early detection of glaucoma and other disorders of the eye. Is the patient up to date with their annual eye exam?  Yes  Who is the provider or what is the name of the office in which the patient attends annual eye exams? Pt not sure If pt is not established with a provider, would they like to be referred to a provider to establish care? No .   Dental Screening: Recommended annual dental exams for proper oral hygiene  Community Resource Referral / Chronic Care Management: CRR required this visit?  No   CCM required this visit?  No      Plan:     I have personally reviewed and noted the following in the patients chart:   Medical and social history Use of alcohol, tobacco or illicit drugs  Current medications and supplements including opioid prescriptions.  Functional ability and status Nutritional status Physical activity Advanced directives List of other physicians Hospitalizations, surgeries, and ER visits in previous 12 months Vitals Screenings to include cognitive, depression, and falls Referrals and appointments  In addition, I have reviewed and discussed with patient certain preventive protocols, quality metrics, and best practice recommendations. A written personalized care plan for preventive services as well as general preventive health recommendations were provided to patient.     Earline [place], [place]   12/03/2021   Nurse Notes:  Ms. Puccini , Thank you for taking time to come for your Medicare Wellness Visit. I  appreciate your ongoing commitment to your health goals. Please review the following plan we discussed and let me know if I can assist you in the future.   These are the goals we discussed:  Goals      Glycemic Management Optimized     Evidence-based guidance:   Anticipate A1C testing (point-of-care) every 3 to 6 months based on goal attainment.  Review mutually-set A1C goal or target range.  Anticipate screening for thyroid dysfunction, adrenal dysfunction and celiac disease based on presenting signs/symptoms.  Anticipate use of insulin, multidose or continuous subcutaneous infusion with periodic adjustments; consider active involvement of pharmacist.  Explore child and caregiver fear of hypoglycemic episodes that may impact adherence to treatment plan, such as withholding insulin and allowing blood sugars to be a little high.  Provide medical nutrition therapy and development of individualized eating plan.  Compare child or caregiver reported symptoms of hypo or hyperglycemia to blood glucose levels, diet and fluid intake, current medications, psychosocial and physiologic stressors, change in activity and barriers to care adherence.  Promote self-monitoring of blood glucose levels, interval or continuous.  Assess and address barriers regarding adherence to treatment and self-management plan, including patient factors, such as family cohesion, age, developmental ability, depression, anxiety, fear of hypoglycemia or weight gain, as   well as medication cost, side effects and complicated regimen.  Encourage developmentally-appropriate balance between dependence and independence in care, especially when intensifying treatment, such as continuous blood glucose monitoring or the use of an insulin pump.  Consider referral to community-based  diabetes education program, visiting nurse, community health worker or health coach.  Initiate or review diabetes medical management plan for school that may  include storage of insulin and clean, private location for insulin administration or blood glucose monitoring.    Notes:         This is a list of the screening recommended for you and due dates:  Health Maintenance  Topic Date Due   Pneumonia Vaccine (1 - PCV) Never done   Complete foot exam   Never done   Eye exam for diabetics  Never done   Hepatitis C Screening: USPSTF Recommendation to screen - Ages 76-79 yo.  Never done   Zoster (Shingles) Vaccine (1 of 2) Never done   DEXA scan (bone density measurement)  Never done   Mammogram  11/15/2020   Hemoglobin A1C  03/24/2021   COVID-19 Vaccine (5 - Booster for Moderna series) 12/08/2021   Tetanus Vaccine  01/01/2025   Colon Cancer Screening  05/25/2026   Flu Shot  Completed   HPV Vaccine  Aged Out

## 2021-12-03 NOTE — Patient Instructions (Addendum)
Tracy Salas , Thank you for taking time to come for your Medicare Wellness Visit. I appreciate your ongoing commitment to your health goals. Please review the following plan we discussed and let me know if I can assist you in the future.   These are the goals we discussed:  Goals      Glycemic Management Optimized     Evidence-based guidance:   Anticipate A1C testing (point-of-care) every 3 to 6 months based on goal attainment.  Review mutually-set A1C goal or target range.  Anticipate screening for thyroid dysfunction, adrenal dysfunction and celiac disease based on presenting signs/symptoms.  Anticipate use of insulin, multidose or continuous subcutaneous infusion with periodic adjustments; consider active involvement of pharmacist.  Explore child and caregiver fear of hypoglycemic episodes that may impact adherence to treatment plan, such as withholding insulin and allowing blood sugars to be a little high.  Provide medical nutrition therapy and development of individualized eating plan.  Compare child or caregiver reported symptoms of hypo or hyperglycemia to blood glucose levels, diet and fluid intake, current medications, psychosocial and physiologic stressors, change in activity and barriers to care adherence.  Promote self-monitoring of blood glucose levels, interval or continuous.  Assess and address barriers regarding adherence to treatment and self-management plan, including patient factors, such as family cohesion, age, developmental ability, depression, anxiety, fear of hypoglycemia or weight gain, as   well as medication cost, side effects and complicated regimen.  Encourage developmentally-appropriate balance between dependence and independence in care, especially when intensifying treatment, such as continuous blood glucose monitoring or the use of an insulin pump.  Consider referral to community-based diabetes education program, visiting nurse, community health worker or  health coach.  Initiate or review diabetes medical management plan for school that may include storage of insulin and clean, private location for insulin administration or blood glucose monitoring.    Notes:         This is a list of the screening recommended for you and due dates:  Health Maintenance  Topic Date Due   Pneumonia Vaccine (1 - PCV) Never done   Complete foot exam   Never done   Eye exam for diabetics  Never done   Hepatitis C Screening: USPSTF Recommendation to screen - Ages 7-79 yo.  Never done   Zoster (Shingles) Vaccine (1 of 2) Never done   DEXA scan (bone density measurement)  Never done   Mammogram  11/15/2020   Hemoglobin A1C  03/24/2021   COVID-19 Vaccine (5 - Booster for Moderna series) 12/08/2021   Tetanus Vaccine  01/01/2025   Colon Cancer Screening  05/25/2026   Flu Shot  Completed   HPV Vaccine  Aged Out    Health Maintenance, Female Adopting a healthy lifestyle and getting preventive care are important in promoting health and wellness. Ask your health care provider about: The right schedule for you to have regular tests and exams. Things you can do on your own to prevent diseases and keep yourself healthy. What should I know about diet, weight, and exercise? Eat a healthy diet  Eat a diet that includes plenty of vegetables, fruits, low-fat dairy products, and lean protein. Do not eat a lot of foods that are high in solid fats, added sugars, or sodium. Maintain a healthy weight Body mass index (BMI) is used to identify weight problems. It estimates body fat based on height and weight. Your health care provider can help determine your BMI and help you achieve or maintain a healthy  weight. Get regular exercise Get regular exercise. This is one of the most important things you can do for your health. Most adults should: Exercise for at least 150 minutes each week. The exercise should increase your heart rate and make you sweat (moderate-intensity  exercise). Do strengthening exercises at least twice a week. This is in addition to the moderate-intensity exercise. Spend less time sitting. Even light physical activity can be beneficial. Watch cholesterol and blood lipids Have your blood tested for lipids and cholesterol at 71 years of age, then have this test every 5 years. Have your cholesterol levels checked more often if: Your lipid or cholesterol levels are high. You are older than 71 years of age. You are at high risk for heart disease. What should I know about cancer screening? Depending on your health history and family history, you may need to have cancer screening at various ages. This may include screening for: Breast cancer. Cervical cancer. Colorectal cancer. Skin cancer. Lung cancer. What should I know about heart disease, diabetes, and high blood pressure? Blood pressure and heart disease High blood pressure causes heart disease and increases the risk of stroke. This is more likely to develop in people who have high blood pressure readings or are overweight. Have your blood pressure checked: Every 3-5 years if you are 5618-71 years of age. Every year if you are 71 years old or older. Diabetes Have regular diabetes screenings. This checks your fasting blood sugar level. Have the screening done: Once every three years after age 71 if you are at a normal weight and have a low risk for diabetes. More often and at a younger age if you are overweight or have a high risk for diabetes. What should I know about preventing infection? Hepatitis B If you have a higher risk for hepatitis B, you should be screened for this virus. Talk with your health care provider to find out if you are at risk for hepatitis B infection. Hepatitis C Testing is recommended for: Everyone born from [id number] through 1965. Anyone with known risk factors for hepatitis C. Sexually transmitted infections (STIs) Get screened for STIs, including gonorrhea and  chlamydia, if: You are sexually active and are younger than 71 years of age. You are older than 10724 years of age and your health care provider tells you that you are at risk for this type of infection. Your sexual activity has changed since you were last screened, and you are at increased risk for chlamydia or gonorrhea. Ask your health care provider if you are at risk. Ask your health care provider about whether you are at high risk for HIV. Your health care provider may recommend a prescription medicine to help prevent HIV infection. If you choose to take medicine to prevent HIV, you should first get tested for HIV. You should then be tested every 3 months for as long as you are taking the medicine. Pregnancy If you are about to stop having your period (premenopausal) and you may become pregnant, seek counseling before you get pregnant. Take 400 to 800 micrograms (mcg) of folic acid every day if you become pregnant. Ask for birth control (contraception) if you want to prevent pregnancy. Osteoporosis and menopause Osteoporosis is a disease in which the bones lose minerals and strength with aging. This can result in bone fractures. If you are 71 years old or older, or if you are at risk for osteoporosis and fractures, ask your health care provider if you should: Be screened for  bone loss. Take a calcium or vitamin D supplement to lower your risk of fractures. Be given hormone replacement therapy (HRT) to treat symptoms of menopause. Follow these instructions at home: Alcohol use Do not drink alcohol if: Your health care provider tells you not to drink. You are pregnant, may be pregnant, or are planning to become pregnant. If you drink alcohol: Limit how much you have to: 0-1 drink a day. Know how much alcohol is in your drink. In the [place] one drink equals one 12 oz bottle of beer (355 mL), one 5 oz glass of wine (148 mL), or one 1 oz glass of hard liquor (44 mL). Lifestyle Do not use any  products that contain nicotine or tobacco. These products include cigarettes, chewing tobacco, and vaping devices, such as e-cigarettes. If you need help quitting, ask your health care provider. Do not use street drugs. Do not share needles. Ask your health care provider for help if you need support or information about quitting drugs. General instructions Schedule regular health, dental, and eye exams. Stay current with your vaccines. Tell your health care provider if: You often feel depressed. You have ever been abused or do not feel safe at home. Summary Adopting a healthy lifestyle and getting preventive care are important in promoting health and wellness. Follow your health care provider's instructions about healthy diet, exercising, and getting tested or screened for diseases. Follow your health care provider's instructions on monitoring your cholesterol and blood pressure. This information is not intended to replace advice given to you by your health care provider. Make sure you discuss any questions you have with your health care provider. Document Revised: 03/23/2021 Document Reviewed: 03/23/2021 Elsevier Patient Education  2022 [employer].

## 2021-12-04 ENCOUNTER — Encounter: Payer: Self-pay | Admitting: Nurse Practitioner

## 2021-12-04 ENCOUNTER — Other Ambulatory Visit: Payer: Self-pay

## 2021-12-04 ENCOUNTER — Ambulatory Visit (INDEPENDENT_AMBULATORY_CARE_PROVIDER_SITE_OTHER): Payer: Medicare Other | Admitting: Nurse Practitioner

## 2021-12-04 VITALS — BP 162/86 | HR 98 | Ht 62.0 in | Wt 217.0 lb

## 2021-12-04 DIAGNOSIS — I10 Essential (primary) hypertension: Secondary | ICD-10-CM | POA: Diagnosis not present

## 2021-12-04 DIAGNOSIS — G8929 Other chronic pain: Secondary | ICD-10-CM

## 2021-12-04 DIAGNOSIS — M25561 Pain in right knee: Secondary | ICD-10-CM

## 2021-12-04 DIAGNOSIS — E785 Hyperlipidemia, unspecified: Secondary | ICD-10-CM

## 2021-12-04 DIAGNOSIS — M25562 Pain in left knee: Secondary | ICD-10-CM | POA: Diagnosis not present

## 2021-12-04 DIAGNOSIS — E1165 Type 2 diabetes mellitus with hyperglycemia: Secondary | ICD-10-CM

## 2021-12-04 DIAGNOSIS — Z6841 Body Mass Index (BMI) 40.0 and over, adult: Secondary | ICD-10-CM

## 2021-12-04 LAB — POCT GLYCOSYLATED HEMOGLOBIN (HGB A1C): Hemoglobin A1C: 7.5 % — AB (ref 4.0–5.6)

## 2021-12-04 NOTE — Assessment & Plan Note (Signed)
Wt Readings from Last 3 Encounters:  12/04/21 217 lb (98.4 kg)  07/01/21 219 lb (99.3 kg)  11/05/20 223 lb (101.2 kg)  Importance of healthy food choices with portion control discussed as well as eating regularly within 12  hour window.   The need to choose clean green food 50%-75% of time is discussed as well as make water the primary drink and set a goal for 64 ounces daily.  Patient reeducated about the importance of committment to minimum of 150 minutes of exercise per week.  Three meals at set times with snacks allowed between meals but they must be fruit or vegetable.   Aim to eat  over 12 hour period  for example 7 am to 7 pm. Stop after your last meal of the day.

## 2021-12-04 NOTE — Assessment & Plan Note (Signed)
Lab Results  Component Value Date   CHOL 162 08/03/2019   HDL 47 08/03/2019   LDLCALC 100 08/03/2019   TRIG 67 08/03/2019  Patient is currently not taking any lipid-lowering agents. Will check lipid levels today.

## 2021-12-04 NOTE — Assessment & Plan Note (Signed)
DASH diet and commitment to daily physical activity for a minimum of 30 minutes discussed and encouraged, as a part of hypertension management. The importance of attaining a healthy weight is also discussed.  BP/Weight 12/04/2021 07/01/2021 11/05/2020 10/21/2020 10/07/2020 09/23/2020 01/01/8374  Systolic BP 423 - 702 301 - 720 910  Diastolic BP 86 - 88 53 - 82 83  Wt. (Lbs) 217 219 223 220 226 232 228  BMI 39.69 40.06 40.79 40.24 41.34 42.43 41.7  check EGFR today will increase   lisinopril-HTCZ to 20-12.5 if labs are ok.

## 2021-12-04 NOTE — Assessment & Plan Note (Signed)
Lab Results  Component Value Date   HGBA1C 7.5 (A) 12/04/2021  EGFR today lipid panel today  Will adjust medication once labs results are out Foot exam done today. Will request result of diabetic eye exam from pt's eye doctor, stated that she had a diabetic  eye exam in the past year

## 2021-12-04 NOTE — Progress Notes (Signed)
° °  Tracy Salas     MRN: [id number]      DOB: 06/16/1951   HPI Tracy Salas is here for follow up and re-evaluation of chronic medical conditions, medication management and review of any available recent lab and radiology data.  Preventive health is updated, specifically  Cancer screening and Immunization.   Questions or concerns regarding consultations or procedures which the PT has had in the interim are  addressed. The PT denies any adverse reactions to current medications since the last visit.    Patient and her son educated on the need for patient to be seen regularly in the office.  Verbalized understanding  Pt c/o coughing, wheezing that started 3 days but her symptoms are gone now.   Patient complained of bilateral knee arthritis, she stated that she takes Tylenol as needed.  She wears  left knee brace, patient stated as wearing brace and using tylenol helps her pain. She stated that I dont like taking too much pills.       ROS Denies recent fever or chills. Denies sinus pressure, nasal congestion, ear pain or sore throat. Denies chest congestion, productive cough has wheezing. Denies chest pains, palpitations and leg swelling Denies abdominal pain, nausea, vomiting,diarrhea or constipation.   Denies dysuria, frequency, hesitancy or incontinence. Has knee pain uses a cane  Denies headaches, seizures, numbness, or tingling. Denies depression, anxiety or insomnia.    PE  BP (!) 188/77 (BP Location: Right Arm, Patient Position: Sitting, Cuff Size: Large)    Pulse 98    Ht  (1.575 m)    Wt 217 lb (98.4 kg)    SpO2 92%    BMI 39.69 kg/m   Patient alert and oriented and in no cardiopulmonary distress.  Chest: Clear to auscultation bilaterally.  CVS: S1, S2 no murmurs, no S3.Regular rate.  ABD: Soft non tender.   Ext: No edema  MS: Adequate ROM spine, shoulders, hips and knees, uses a cane  Skin: Intact, no ulcerations or rash noted.  Psych: Good eye  contact, normal affect. Memory intact not anxious or depressed appearing. d.  Assessment & Plan

## 2021-12-04 NOTE — Assessment & Plan Note (Signed)
Has a brace on left knee , has been using tylenol as need at home Will RX ibuprofen if EGFR is normal.  Continue tylenol for now

## 2021-12-04 NOTE — Patient Instructions (Signed)
Pleas get your lab work done today  It is important that you exercise regularly at least 30 minutes 5 times a week.  Think about what you will eat, plan ahead. Choose " clean, green, fresh or frozen" over canned, processed or packaged foods which are more sugary, salty and fatty. 70 to 75% of food eaten should be vegetables and fruit. Three meals at set times with snacks allowed between meals, but they must be fruit or vegetables. Aim to eat over a 12 hour period , example 7 am to 7 pm, and STOP after  your last meal of the day. Drink water,generally about 64 ounces per day, no other drink is as healthy. Fruit juice is best enjoyed in a healthy way, by EATING the fruit.  Thanks for choosing [facility], we consider it a privelige to serve you.

## 2021-12-05 ENCOUNTER — Other Ambulatory Visit: Payer: Self-pay | Admitting: Nurse Practitioner

## 2021-12-05 DIAGNOSIS — E1165 Type 2 diabetes mellitus with hyperglycemia: Secondary | ICD-10-CM

## 2021-12-05 DIAGNOSIS — E1169 Type 2 diabetes mellitus with other specified complication: Secondary | ICD-10-CM

## 2021-12-05 LAB — CBC WITH DIFFERENTIAL/PLATELET
Basophils Absolute: 0 10*3/uL (ref 0.0–0.2)
Basos: 0 %
EOS (ABSOLUTE): 0.2 10*3/uL (ref 0.0–0.4)
Eos: 2 %
Hematocrit: 44.1 % (ref 34.0–46.6)
Hemoglobin: 15.4 g/dL (ref 11.1–15.9)
Immature Grans (Abs): 0 10*3/uL (ref 0.0–0.1)
Immature Granulocytes: 0 %
Lymphocytes Absolute: 2.2 10*3/uL (ref 0.7–3.1)
Lymphs: 21 %
MCH: 30.1 pg (ref 26.6–33.0)
MCHC: 34.9 g/dL (ref 31.5–35.7)
MCV: 86 fL (ref 79–97)
Monocytes Absolute: 1 10*3/uL — ABNORMAL HIGH (ref 0.1–0.9)
Monocytes: 9 %
Neutrophils Absolute: 7 10*3/uL (ref 1.4–7.0)
Neutrophils: 68 %
Platelets: 355 10*3/uL (ref 150–450)
RBC: 5.11 x10E6/uL (ref 3.77–5.28)
RDW: 11.9 % (ref 11.7–15.4)
WBC: 10.5 10*3/uL (ref 3.4–10.8)

## 2021-12-05 LAB — CMP14+EGFR
ALT: 17 IU/L (ref 0–32)
AST: 17 IU/L (ref 0–40)
Albumin/Globulin Ratio: 1.5 (ref 1.2–2.2)
Albumin: 4.1 g/dL (ref 3.8–4.8)
Alkaline Phosphatase: 108 IU/L (ref 44–121)
BUN/Creatinine Ratio: 25 (ref 12–28)
BUN: 27 mg/dL (ref 8–27)
Bilirubin Total: 0.6 mg/dL (ref 0.0–1.2)
CO2: 23 mmol/L (ref 20–29)
Calcium: 10 mg/dL (ref 8.7–10.3)
Chloride: 100 mmol/L (ref 96–106)
Creatinine, Ser: 1.07 mg/dL — ABNORMAL HIGH (ref 0.57–1.00)
Globulin, Total: 2.8 g/dL (ref 1.5–4.5)
Glucose: 186 mg/dL — ABNORMAL HIGH (ref 70–99)
Potassium: 4.2 mmol/L (ref 3.5–5.2)
Sodium: 140 mmol/L (ref 134–144)
Total Protein: 6.9 g/dL (ref 6.0–8.5)
eGFR: 56 mL/min/{1.73_m2} — ABNORMAL LOW (ref >59)

## 2021-12-05 LAB — LIPID PANEL
Chol/HDL Ratio: 4.2 ratio (ref 0.0–4.4)
Cholesterol, Total: 186 mg/dL (ref 100–199)
HDL: 44 mg/dL (ref >39)
LDL Chol Calc (NIH): 122 mg/dL — ABNORMAL HIGH (ref 0–99)
Triglycerides: 111 mg/dL (ref 0–149)
VLDL Cholesterol Cal: 20 mg/dL (ref 5–40)

## 2021-12-05 MED ORDER — GLIPIZIDE 10 MG PO TABS: ORAL_TABLET | ORAL | 1 refills | Status: DC

## 2021-12-05 MED ORDER — ATORVASTATIN CALCIUM 10 MG PO TABS: 10.0000 mg | ORAL_TABLET | Freq: Every day | ORAL | 3 refills | Status: DC

## 2021-12-05 NOTE — Progress Notes (Signed)
Please review labs with patient. DM.  Patient should start taking glipizide 10 mg in the morning with breakfast continue with 5 mg in the evening with dinner.  HLD.  Patient should start taking atorvastatin 10 mg daily for her cholesterol.  She should reports muscle pain.  Patient should get labs done 3 to 5 days before next visit or come fasting to the appointment so we can check her labs.  Please schedule patient for a 6-week follow-up appointment for her cholesterol.  Kidney function is slightly reduced. Patient should avoid NSAIDs drink plenty of water to stay hydrated. Thanks

## 2021-12-21 ENCOUNTER — Encounter: Payer: Self-pay | Admitting: *Deleted

## 2022-01-08 ENCOUNTER — Ambulatory Visit (INDEPENDENT_AMBULATORY_CARE_PROVIDER_SITE_OTHER): Payer: Medicare Other | Admitting: Nurse Practitioner

## 2022-01-08 ENCOUNTER — Encounter: Payer: Self-pay | Admitting: Nurse Practitioner

## 2022-01-08 ENCOUNTER — Other Ambulatory Visit: Payer: Self-pay

## 2022-01-08 VITALS — BP 168/90 | HR 90 | Ht 62.0 in | Wt 215.0 lb

## 2022-01-08 DIAGNOSIS — I10 Essential (primary) hypertension: Secondary | ICD-10-CM | POA: Diagnosis not present

## 2022-01-08 DIAGNOSIS — E785 Hyperlipidemia, unspecified: Secondary | ICD-10-CM

## 2022-01-08 DIAGNOSIS — R197 Diarrhea, unspecified: Secondary | ICD-10-CM | POA: Diagnosis not present

## 2022-01-08 DIAGNOSIS — E1165 Type 2 diabetes mellitus with hyperglycemia: Secondary | ICD-10-CM

## 2022-01-08 MED ORDER — AMLODIPINE BESYLATE 2.5 MG PO TABS: 2.5000 mg | ORAL_TABLET | Freq: Every day | ORAL | 1 refills | Status: DC

## 2022-01-08 MED ORDER — LOPERAMIDE HCL 2 MG PO TABS: 2.0000 mg | ORAL_TABLET | Freq: Four times a day (QID) | ORAL | 0 refills | Status: DC | PRN

## 2022-01-08 NOTE — Assessment & Plan Note (Signed)
Takes glipizide 10 mg every morning, glipizide 5 mg every afternoon Patient states that she has been taking medication as prescribed. Avoid sugar sweets soda. Lab Results  Component Value Date   HGBA1C 7.5 (A) 12/04/2021

## 2022-01-08 NOTE — Assessment & Plan Note (Signed)
Intermittent diarrhea that started since her last visit about 6 weeks ago. Take Imodium 2 mg 4 times daily as needed for diarrhea. Patient told to take water to stay hydrated.

## 2022-01-08 NOTE — Patient Instructions (Addendum)
Please take amlodipine 2.5mg  daily for your blood pressure, Continue lisinopril-hydrochlorothiazide 10-12.5mg  daily Pleas check your blood pressure three times a week, write down the number and bring to your follow up appointment.   Take imodium 1 tablet (2 mg total) by mouth 4 (four) times daily as needed for diarrhea or loose stools   It is important that you exercise regularly at least 30 minutes 5 times a week.  Think about what you will eat, plan ahead. Choose " clean, green, fresh or frozen" over canned, processed or packaged foods which are more sugary, salty and fatty. 70 to 75% of food eaten should be vegetables and fruit. Three meals at set times with snacks allowed between meals, but they must be fruit or vegetables. Aim to eat over a 12 hour period , example 7 am to 7 pm, and STOP after  your last meal of the day. Drink water,generally about 64 ounces per day, no other drink is as healthy. Fruit juice is best enjoyed in a healthy way, by EATING the fruit.  Thanks for choosing [facility], we consider it a privelige to serve you.

## 2022-01-08 NOTE — Progress Notes (Signed)
° °  Tracy Salas     MRN: [id number]      DOB: 07-Jan-1951   HPI [name] past medical history of essential hypertension, type 2 diabetes ,vitamin D deficiency, morbid obesity ,hyperlipidemia is here for follow up for DM and HTN   Pt c/o of high BP , blood pressure running high and dizziness the last two months.  Patient states that she has been taking lisinopril hydrochlorothiazide 10-12.5 mg daily but she has not been checking her blood pressure at home.   Pt c/o of intermittent diarrhea that started since her last visit, pt states that she does not have diarrhea all the time but when she has diarrhea, she has it all day  Pt denies abdominal pain, bloody stool, fever, chills. she has not changed her diet recently,    ROS Denies recent fever or chills. Denies sinus pressure, nasal congestion, ear pain or sore throat. Denies chest congestion, productive cough or wheezing. Denies chest pains, palpitations and leg swelling, has dizziness Denies abdominal pain, bloody stools,nausea, vomiting or constipation has diarrhea  Denies dysuria, frequency, hesitancy or incontinence Denies headaches, seizures, numbness, or tingling. Denies depression, anxiety or insomnia. [name]   PE  BP (!) 174/84    Pulse 90    Ht  (1.575 m)    Wt 215 lb (97.5 kg)    SpO2 93%    BMI 39.32 kg/m   Patient alert and oriented and in no cardiopulmonary distress.  Chest: Clear to auscultation bilaterally.  CVS: S1, S2 no murmurs, no S3.Regular rate.  ABD: Soft non tender.   MS: decreased ROM spine, shoulders, hips and knees uses a cane   Psych: Good eye contact, normal affect. Memory intact not anxious or depressed appearing.  CNS: CN 2-12 intact, power,  normal throughout.no focal deficits noted.   Assessment & Plan

## 2022-01-08 NOTE — Assessment & Plan Note (Addendum)
BP Readings from Last 3 Encounters:  01/08/22 (!) 168/90  12/04/21 (!) 162/86  11/05/20 (!) 156/88  Takes lisinopril-hydrochlorothiazide 10-12.5 mg tablet daily Blood pressure not under control, she has not been checking her blood pressure at home. Patient told to start taking amlodipine 2.5 mg daily Continue lisinopril-hydrochlorothiazide 10-12.5 mg daily Monitor blood pressure 3 times at home write down the numbers and bring to next appointment. DASH diet advised, need for regular exercise 30 minutes daily as tolerated 5 times a week discussed with patient she verbalized understanding. Follow-up in 4 weeks to recheck blood pressure.

## 2022-01-08 NOTE — Assessment & Plan Note (Addendum)
Patient was started on atorvastatin 10 mg daily at her last visit. Patient states that she has been taking medication as prescribed. Check lipid panel, CMP+EGFR today.  . Lab Results  Component Value Date   CHOL 186 12/04/2021   HDL 44 12/04/2021   LDLCALC 122 (H) 12/04/2021   TRIG 111 12/04/2021   CHOLHDL 4.2 12/04/2021

## 2022-01-18 ENCOUNTER — Ambulatory Visit: Payer: Medicare Other | Admitting: Nurse Practitioner

## 2022-02-16 ENCOUNTER — Other Ambulatory Visit: Payer: Self-pay

## 2022-02-16 DIAGNOSIS — I10 Essential (primary) hypertension: Secondary | ICD-10-CM

## 2022-02-16 MED ORDER — LISINOPRIL-HYDROCHLOROTHIAZIDE 10-12.5 MG PO TABS: 1.0000 | ORAL_TABLET | Freq: Every day | ORAL | 1 refills | Status: DC

## 2022-03-05 ENCOUNTER — Ambulatory Visit: Payer: Medicare Other | Admitting: Nurse Practitioner

## 2022-03-18 ENCOUNTER — Telehealth: Payer: Self-pay

## 2022-03-18 ENCOUNTER — Other Ambulatory Visit: Payer: Self-pay

## 2022-03-18 DIAGNOSIS — I10 Essential (primary) hypertension: Secondary | ICD-10-CM

## 2022-03-18 MED ORDER — LISINOPRIL-HYDROCHLOROTHIAZIDE 10-12.5 MG PO TABS: 1.0000 | ORAL_TABLET | Freq: Every day | ORAL | 2 refills | Status: DC

## 2022-03-18 NOTE — Telephone Encounter (Signed)
Lupita Leash from [facility] called and said the patient insurance will cover 100 day supply for this medicine, please send to the pharmacy ?lisinopril-hydrochlorothiazide (ZESTORETIC) 10-12.5 MG tablet  ? ?Pharmacy ? ?[facility] - [place], [place] - [address]  ?[address], [place] [address]  ?Phone:  [phone]  Fax:  [phone]  ?DEA #:  [id number]  ? ?

## 2022-03-18 NOTE — Telephone Encounter (Signed)
Sent 90 day

## 2022-04-15 ENCOUNTER — Ambulatory Visit: Payer: Medicare Other | Admitting: Nurse Practitioner

## 2022-07-21 ENCOUNTER — Other Ambulatory Visit: Payer: Self-pay

## 2022-07-21 DIAGNOSIS — Z1231 Encounter for screening mammogram for malignant neoplasm of breast: Secondary | ICD-10-CM

## 2022-09-17 ENCOUNTER — Encounter: Payer: Self-pay | Admitting: Internal Medicine

## 2022-09-17 ENCOUNTER — Ambulatory Visit (INDEPENDENT_AMBULATORY_CARE_PROVIDER_SITE_OTHER): Payer: Medicare Other | Admitting: Internal Medicine

## 2022-09-17 VITALS — BP 172/78 | HR 97 | Ht 62.0 in | Wt 212.2 lb

## 2022-09-17 DIAGNOSIS — R110 Nausea: Secondary | ICD-10-CM

## 2022-09-17 DIAGNOSIS — Z1159 Encounter for screening for other viral diseases: Secondary | ICD-10-CM | POA: Diagnosis not present

## 2022-09-17 DIAGNOSIS — E785 Hyperlipidemia, unspecified: Secondary | ICD-10-CM | POA: Diagnosis not present

## 2022-09-17 DIAGNOSIS — E559 Vitamin D deficiency, unspecified: Secondary | ICD-10-CM

## 2022-09-17 DIAGNOSIS — E1165 Type 2 diabetes mellitus with hyperglycemia: Secondary | ICD-10-CM | POA: Diagnosis not present

## 2022-09-17 DIAGNOSIS — I10 Essential (primary) hypertension: Secondary | ICD-10-CM

## 2022-09-17 DIAGNOSIS — M25522 Pain in left elbow: Secondary | ICD-10-CM | POA: Diagnosis not present

## 2022-09-17 DIAGNOSIS — Z23 Encounter for immunization: Secondary | ICD-10-CM | POA: Insufficient documentation

## 2022-09-17 MED ORDER — ONDANSETRON HCL 4 MG PO TABS: 4.0000 mg | ORAL_TABLET | Freq: Three times a day (TID) | ORAL | 0 refills | Status: AC | PRN

## 2022-09-17 MED ORDER — LISINOPRIL-HYDROCHLOROTHIAZIDE 20-12.5 MG PO TABS: 1.0000 | ORAL_TABLET | Freq: Every day | ORAL | 3 refills | Status: DC

## 2022-09-17 NOTE — Assessment & Plan Note (Signed)
Endorses a recent history of nausea without vomiting.  This is happening 2-3 times weekly.  She requests medication today for as needed nausea relief.   -Zofran 4 mg as needed x20 tablets prescribed today

## 2022-09-17 NOTE — Assessment & Plan Note (Signed)
She is currently prescribed atorvastatin 10 mg daily, but is not taking the medication due to myalgias.  Her last lipid panel was updated in January 2023.  LDL 122 at that time.  Her 10-year ASCVD risk score today is 42.3%.   -Repeat lipid panel ordered today -She will follow-up in 2 weeks to discuss treatment options

## 2022-09-17 NOTE — Assessment & Plan Note (Signed)
She has a prior history of vitamin D deficiency.  Her most recent vitamin D level was 22 in September 2020. -Repeat vitamin D level ordered today

## 2022-09-17 NOTE — Assessment & Plan Note (Signed)
PCV 20 administered today 

## 2022-09-17 NOTE — Patient Instructions (Signed)
It was a pleasure to see you today.  Thank you for giving [place] the opportunity to be involved in your care.  Below is a brief recap of your visit and next steps.  We will plan to see you again in 2 weeks.  Summary I have increased lisinopril-hctz today to 20-12.5 mg daily We will repeat labs and follow up in 2 weeks You will receive your pneumonia vaccine today and flu shot at follow up in 2 weeks I recommend purchasing Voltaren gel to apply to the area of pain and swelling on your elbow.

## 2022-09-17 NOTE — Progress Notes (Signed)
Established Patient Office Visit  Subjective   Patient ID: Tracy Salas, female    DOB: 1951-10-31  Age: 71 y.o. MRN: [id number]  Chief Complaint  Patient presents with   Follow-up    HTN,  knot on left elbow warm to touch, and not been feeling very well.   Tracy Salas returns to care today.  She is a 71 year old woman with a past medical history significant for HTN, T2DM, HLD, vitamin D deficiency.  She was last seen at [facility] on 01/08/2022 by Vena Rua, NP.  Her blood pressure was elevated at that time and amlodipine 2.5 mg daily was added for HTN.  There have been no acute interval events.  Today Tracy Salas endorses left elbow discomfort and recent nausea.  She reports a 2-week history of painful knot on her left elbow.  She denies inciting event or trauma at the onset of pain.  She points to an area on the extensor surface just below her olecranon that is erythematous and swollen.  She is able to bend her elbow without difficulty.  She denies systemic symptoms of fever and chills.  Tracy Salas also endorses recent nausea without vomiting.  She states that this happens 2 times weekly.  She requests a medication for nausea relief as needed.  Acute concerns, chronic medical conditions, and outstanding preventative care items discussed today are individually addressed in A/P below.  Past Medical History:  Diagnosis Date   Dermatitis, unspecified 03/10/2020   Diabetes mellitus without complication ([place])    Hypertension    Vitamin D deficiency    Past Surgical History:  Procedure Laterality Date   CHOLECYSTECTOMY     TUMOR REMOVAL  2018   Social History   Tobacco Use   Smoking status: Never   Smokeless tobacco: Never  Vaping Use   Vaping Use: Never used  Substance Use Topics   Alcohol use: Never   Drug use: Never   Family History  Problem Relation Age of Onset   Alzheimer's disease Mother    Stroke Father    Allergies  Allergen Reactions   Penicillins Shortness Of  Breath and Rash    Did it involve swelling of the face/tongue/throat, SOB, or low BP? Yes Did it involve sudden or severe rash/hives, skin peeling, or any reaction on the inside of your mouth or nose? Yes Did you need to seek medical attention at a hospital or doctor's office? Unknown When did it last happen? over 79 years--71 years old      If all above answers are "NO", may proceed with cephalosporin use.    Metformin And Related Itching and Other (See Comments)    Shaking, altered mental status   Review of Systems  Constitutional:  Negative for chills and fever.  HENT:  Negative for sore throat.   Respiratory:  Negative for cough and shortness of breath.   Cardiovascular:  Negative for chest pain, palpitations and leg swelling.  Gastrointestinal:  Positive for nausea. Negative for abdominal pain, blood in stool, constipation, diarrhea and vomiting.  Genitourinary:  Negative for dysuria and hematuria.  Musculoskeletal:  Positive for joint pain (Left elbow pain). Negative for myalgias.  Skin:  Negative for itching and rash.  Neurological:  Negative for dizziness and headaches.  Psychiatric/Behavioral:  Negative for depression and suicidal ideas.      Objective:     BP (!) 174/81   Pulse 97   Ht _0  (1.575 m)   Wt 212 lb 3.2 oz (96.3  kg)   SpO2 94%   BMI 38.81 kg/m  BP Readings from Last 3 Encounters:  09/17/22 (!) 174/81  01/08/22 (!) 168/90  12/04/21 (!) 162/86      Physical Exam Vitals reviewed.  Constitutional:      General: She is not in acute distress.    Appearance: Normal appearance. She is obese. She is not toxic-appearing.  HENT:     Head: Normocephalic and atraumatic.     Right Ear: External ear normal.     Left Ear: External ear normal.     Nose: Nose normal. No congestion or rhinorrhea.     Mouth/Throat:     Mouth: Mucous membranes are moist.     Pharynx: Oropharynx is clear. No oropharyngeal exudate or posterior oropharyngeal erythema.  Eyes:      General: No scleral icterus.    Extraocular Movements: Extraocular movements intact.     Conjunctiva/sclera: Conjunctivae normal.     Pupils: Pupils are equal, round, and reactive to light.  Cardiovascular:     Rate and Rhythm: Normal rate and regular rhythm.     Pulses: Normal pulses.     Heart sounds: Normal heart sounds. No murmur heard.    No friction rub. No gallop.  Pulmonary:     Effort: Pulmonary effort is normal.     Breath sounds: Normal breath sounds. No wheezing, rhonchi or rales.  Abdominal:     General: Abdomen is flat. Bowel sounds are normal. There is no distension.     Palpations: Abdomen is soft.     Tenderness: There is no abdominal tenderness.  Musculoskeletal:        General: Normal range of motion.     Cervical back: Normal range of motion.     Right lower leg: No edema.     Left lower leg: No edema.     Comments: There is erythema below the left olecranon that is tender to palpation.  There is a palpable knot.  ROM of the left elbow is intact.  There is no joint effusion present.  The distal left upper extremity is neurovascularly intact.  Lymphadenopathy:     Cervical: No cervical adenopathy.  Skin:    General: Skin is warm and dry.     Capillary Refill: Capillary refill takes less than 2 seconds.     Coloration: Skin is not jaundiced.  Neurological:     General: No focal deficit present.     Mental Status: She is alert and oriented to person, place, and time.  Psychiatric:        Mood and Affect: Mood normal.        Behavior: Behavior normal.    Last CBC Lab Results  Component Value Date   WBC 10.5 12/04/2021   HGB 15.4 12/04/2021   HCT 44.1 12/04/2021   MCV 86 12/04/2021   MCH 30.1 12/04/2021   RDW 11.9 12/04/2021   PLT 355 67/34/1937   Last metabolic panel Lab Results  Component Value Date   GLUCOSE 186 (H) 12/04/2021   NA 140 12/04/2021   K 4.2 12/04/2021   CL 100 12/04/2021   CO2 23 12/04/2021   BUN 27 12/04/2021   CREATININE 1.07  (H) 12/04/2021   EGFR 56 (L) 12/04/2021   CALCIUM 10.0 12/04/2021   PROT 6.9 12/04/2021   ALBUMIN 4.1 12/04/2021   LABGLOB 2.8 12/04/2021   AGRATIO 1.5 12/04/2021   BILITOT 0.6 12/04/2021   ALKPHOS 108 12/04/2021   AST 17 12/04/2021  ALT 17 12/04/2021   ANIONGAP 11 11/29/2018   Last lipids Lab Results  Component Value Date   CHOL 186 12/04/2021   HDL 44 12/04/2021   LDLCALC 122 (H) 12/04/2021   TRIG 111 12/04/2021   CHOLHDL 4.2 12/04/2021   Last hemoglobin A1c Lab Results  Component Value Date   HGBA1C 7.5 (A) 12/04/2021   Last thyroid functions Lab Results  Component Value Date   TSH 1.130 11/29/2018   Last vitamin D Lab Results  Component Value Date   VD25OH 22 08/03/2019   The 10-year ASCVD risk score (Arnett DK, et al., 2019) is: 42.3%    Assessment & Plan:   Problem List Items Addressed This Visit       Essential hypertension - Primary    Poorly controlled.  Amlodipine 2.5 mg daily was added at her last appointment in February, however she never filled the prescription.  She is currently taking lisinopril-HCTZ 10-12.5 daily. -Increase lisinopril-HCTZ to 20-12.5 mg daily. -Follow-up in 2 weeks for BP check      Type 2 diabetes mellitus with hyperglycemia ([place])    Her most recent A1c was 7.5 in January.  She is currently prescribed glipizide 2 mg in the morning and 5 mg in the evening. -No medication changes today -Repeat A1c ordered -Urine microalbumin/creatinine ratio ordered      Vitamin D deficiency    She has a prior history of vitamin D deficiency.  Her most recent vitamin D level was 22 in September 2020. -Repeat vitamin D level ordered today      Hyperlipidemia with target LDL less than 70    She is currently prescribed atorvastatin 10 mg daily, but is not taking the medication due to myalgias.  Her last lipid panel was updated in January 2023.  LDL 122 at that time.  Her 10-year ASCVD risk score today is 42.3%.   -Repeat lipid panel  ordered today -She will follow-up in 2 weeks to discuss treatment options      Left elbow pain    She reports a 2-week history of pain and swelling near her left elbow.  On exam there is an area.  Edema just below the olecranon.  ROM of the left elbow is intact.  There is a palpable, tender knot.  She has no systemic symptoms.  There is no joint effusion of the left elbow.  There is no tenderness or erythema over the epicondyles.  This seems most consistent with bursitis. -I have recommended use of topical anti-inflammatory gel today.  She will return to care in 2 weeks for reassessment.  I instructed her to contact our office in the interim if she develops systemic symptoms or if her pain/swelling significantly worsens.      Nausea    Endorses a recent history of nausea without vomiting.  This is happening 2-3 times weekly.  She requests medication today for as needed nausea relief.   -Zofran 4 mg as needed x20 tablets prescribed today      Need for pneumococcal 20-valent conjugate vaccination    PCV 20 administered today      Return in about 2 weeks (around 10/01/2022) for Left elbow, HTN, DM, HLD, Vit D, influenza vaccine.    Johnette Abraham, MD

## 2022-09-17 NOTE — Assessment & Plan Note (Signed)
Her most recent A1c was 7.5 in January.  She is currently prescribed glipizide 2 mg in the morning and 5 mg in the evening. -No medication changes today -Repeat A1c ordered -Urine microalbumin/creatinine ratio ordered

## 2022-09-17 NOTE — Assessment & Plan Note (Signed)
Poorly controlled.  Amlodipine 2.5 mg daily was added at her last appointment in February, however she never filled the prescription.  She is currently taking lisinopril-HCTZ 10-12.5 daily. -Increase lisinopril-HCTZ to 20-12.5 mg daily. -Follow-up in 2 weeks for BP check

## 2022-09-17 NOTE — Assessment & Plan Note (Signed)
She reports a 2-week history of pain and swelling near her left elbow.  On exam there is an area.  Edema just below the olecranon.  ROM of the left elbow is intact.  There is a palpable, tender knot.  She has no systemic symptoms.  There is no joint effusion of the left elbow.  There is no tenderness or erythema over the epicondyles.  This seems most consistent with bursitis. -I have recommended use of topical anti-inflammatory gel today.  She will return to care in 2 weeks for reassessment.  I instructed her to contact our office in the interim if she develops systemic symptoms or if her pain/swelling significantly worsens.

## 2022-09-18 LAB — LIPID PANEL
Chol/HDL Ratio: 3.7 ratio (ref 0.0–4.4)
Cholesterol, Total: 183 mg/dL (ref 100–199)
HDL: 49 mg/dL (ref >39)
LDL Chol Calc (NIH): 114 mg/dL — ABNORMAL HIGH (ref 0–99)
Triglycerides: 109 mg/dL (ref 0–149)
VLDL Cholesterol Cal: 20 mg/dL (ref 5–40)

## 2022-09-19 LAB — CMP14+EGFR
ALT: 15 IU/L (ref 0–32)
AST: 19 IU/L (ref 0–40)
Albumin/Globulin Ratio: 1.4 (ref 1.2–2.2)
Albumin: 4.3 g/dL (ref 3.8–4.8)
Alkaline Phosphatase: 118 IU/L (ref 44–121)
BUN/Creatinine Ratio: 18 (ref 12–28)
BUN: 21 mg/dL (ref 8–27)
Bilirubin Total: 0.6 mg/dL (ref 0.0–1.2)
CO2: 22 mmol/L (ref 20–29)
Calcium: 9.9 mg/dL (ref 8.7–10.3)
Chloride: 101 mmol/L (ref 96–106)
Creatinine, Ser: 1.2 mg/dL — ABNORMAL HIGH (ref 0.57–1.00)
Globulin, Total: 3 g/dL (ref 1.5–4.5)
Glucose: 185 mg/dL — ABNORMAL HIGH (ref 70–99)
Potassium: 4.5 mmol/L (ref 3.5–5.2)
Sodium: 143 mmol/L (ref 134–144)
Total Protein: 7.3 g/dL (ref 6.0–8.5)
eGFR: 48 mL/min/{1.73_m2} — ABNORMAL LOW (ref >59)

## 2022-09-19 LAB — CBC WITH DIFFERENTIAL/PLATELET
Basophils Absolute: 0.1 10*3/uL (ref 0.0–0.2)
Basos: 0 %
EOS (ABSOLUTE): 0.3 10*3/uL (ref 0.0–0.4)
Eos: 2 %
Hematocrit: 45 % (ref 34.0–46.6)
Hemoglobin: 15.1 g/dL (ref 11.1–15.9)
Immature Grans (Abs): 0 10*3/uL (ref 0.0–0.1)
Immature Granulocytes: 0 %
Lymphocytes Absolute: 2.8 10*3/uL (ref 0.7–3.1)
Lymphs: 23 %
MCH: 29.6 pg (ref 26.6–33.0)
MCHC: 33.6 g/dL (ref 31.5–35.7)
MCV: 88 fL (ref 79–97)
Monocytes Absolute: 1.1 10*3/uL — ABNORMAL HIGH (ref 0.1–0.9)
Monocytes: 9 %
Neutrophils Absolute: 8 10*3/uL — ABNORMAL HIGH (ref 1.4–7.0)
Neutrophils: 66 %
Platelets: 349 10*3/uL (ref 150–450)
RBC: 5.1 x10E6/uL (ref 3.77–5.28)
RDW: 11.6 % — ABNORMAL LOW (ref 11.7–15.4)
WBC: 12.2 10*3/uL — ABNORMAL HIGH (ref 3.4–10.8)

## 2022-09-19 LAB — HCV AB W REFLEX TO QUANT PCR: HCV Ab: NONREACTIVE

## 2022-09-19 LAB — HCV INTERPRETATION

## 2022-09-19 LAB — VITAMIN D 25 HYDROXY (VIT D DEFICIENCY, FRACTURES): Vit D, 25-Hydroxy: 17.3 ng/mL — ABNORMAL LOW (ref 30.0–100.0)

## 2022-09-19 LAB — HEMOGLOBIN A1C
Est. average glucose Bld gHb Est-mCnc: 169 mg/dL
Hgb A1c MFr Bld: 7.5 % — ABNORMAL HIGH (ref 4.8–5.6)

## 2022-09-19 LAB — MICROALBUMIN / CREATININE URINE RATIO
Creatinine, Urine: 175.8 mg/dL
Microalb/Creat Ratio: 64 mg/g creat — ABNORMAL HIGH (ref 0–29)
Microalbumin, Urine: 112.8 ug/mL

## 2022-09-20 ENCOUNTER — Other Ambulatory Visit: Payer: Self-pay | Admitting: Internal Medicine

## 2022-09-20 MED ORDER — VITAMIN D (ERGOCALCIFEROL) 1.25 MG (50000 UNIT) PO CAPS: 50000.0000 [IU] | ORAL_CAPSULE | ORAL | 0 refills | Status: AC

## 2022-10-01 ENCOUNTER — Ambulatory Visit: Payer: Medicare Other | Admitting: Internal Medicine

## 2022-11-09 ENCOUNTER — Telehealth: Payer: Self-pay | Admitting: Internal Medicine

## 2022-11-09 ENCOUNTER — Other Ambulatory Visit: Payer: Self-pay

## 2022-11-09 DIAGNOSIS — E1169 Type 2 diabetes mellitus with other specified complication: Secondary | ICD-10-CM

## 2022-11-09 MED ORDER — ATORVASTATIN CALCIUM 10 MG PO TABS: 10.0000 mg | ORAL_TABLET | Freq: Every day | ORAL | 3 refills | Status: DC

## 2022-11-09 NOTE — Telephone Encounter (Signed)
Patient needs refill on   atorvastatin (LIPITOR) 10 MG tablet [[id number]]

## 2022-12-06 ENCOUNTER — Encounter (INDEPENDENT_AMBULATORY_CARE_PROVIDER_SITE_OTHER): Payer: Medicare Other

## 2022-12-06 DIAGNOSIS — Z0000 Encounter for general adult medical examination without abnormal findings: Secondary | ICD-10-CM | POA: Diagnosis not present

## 2022-12-06 NOTE — Progress Notes (Signed)
This encounter was created in error - please disregard.

## 2022-12-20 ENCOUNTER — Other Ambulatory Visit: Payer: Self-pay

## 2022-12-20 DIAGNOSIS — E1165 Type 2 diabetes mellitus with hyperglycemia: Secondary | ICD-10-CM

## 2022-12-20 MED ORDER — GLIPIZIDE 10 MG PO TABS: ORAL_TABLET | ORAL | 1 refills | Status: DC

## 2023-04-05 ENCOUNTER — Encounter: Payer: Self-pay | Admitting: Internal Medicine

## 2023-05-03 ENCOUNTER — Other Ambulatory Visit: Payer: Self-pay | Admitting: Internal Medicine

## 2023-05-03 DIAGNOSIS — E1165 Type 2 diabetes mellitus with hyperglycemia: Secondary | ICD-10-CM

## 2023-07-27 ENCOUNTER — Telehealth: Payer: Self-pay | Admitting: Internal Medicine

## 2023-07-27 ENCOUNTER — Other Ambulatory Visit: Payer: Self-pay | Admitting: Internal Medicine

## 2023-07-27 ENCOUNTER — Encounter: Payer: Self-pay | Admitting: Internal Medicine

## 2023-07-27 ENCOUNTER — Ambulatory Visit (INDEPENDENT_AMBULATORY_CARE_PROVIDER_SITE_OTHER): Payer: Medicare Other | Admitting: Internal Medicine

## 2023-07-27 VITALS — BP 176/86 | HR 98 | Resp 16 | Ht 62.0 in | Wt 195.6 lb

## 2023-07-27 DIAGNOSIS — F03B0 Unspecified dementia, moderate, without behavioral disturbance, psychotic disturbance, mood disturbance, and anxiety: Secondary | ICD-10-CM | POA: Insufficient documentation

## 2023-07-27 DIAGNOSIS — Z7984 Long term (current) use of oral hypoglycemic drugs: Secondary | ICD-10-CM | POA: Diagnosis not present

## 2023-07-27 DIAGNOSIS — W19XXXA Unspecified fall, initial encounter: Secondary | ICD-10-CM | POA: Insufficient documentation

## 2023-07-27 DIAGNOSIS — I10 Essential (primary) hypertension: Secondary | ICD-10-CM | POA: Diagnosis not present

## 2023-07-27 DIAGNOSIS — F03B18 Unspecified dementia, moderate, with other behavioral disturbance: Secondary | ICD-10-CM

## 2023-07-27 DIAGNOSIS — E1165 Type 2 diabetes mellitus with hyperglycemia: Secondary | ICD-10-CM

## 2023-07-27 NOTE — Assessment & Plan Note (Addendum)
Lab Results  Component Value Date   HGBA1C 7.5 (H) 09/17/2022   Well-controlled for her age On Glipizide 10 mg BID Advised to follow diabetic diet On statin and ACEi F/u CMP and HbA1c Diabetic eye exam: Advised to follow up with Ophthalmology for diabetic eye exam

## 2023-07-27 NOTE — Progress Notes (Addendum)
Established Patient Office Visit  Subjective:  Patient ID: Tracy Salas, female    DOB: Apr 22, 1951  Age: 72 y.o. MRN: [id number]  CC:  Chief Complaint  Patient presents with   Altered Mental Status    Son states she has been having increased confusion    Fall    Feel 2 weeks ago and hurts in her left shoulder     HPI Tracy Salas is a 72 y.o. female with past medical history of HTN, type II DM, HLD and dementia who presents for f/u of her chronic medical conditions.  Her son is present during the visit today.  Fall: She had a fall about 2 weeks ago.  She fell due to tripping on an object in her bedroom, and fell forwards.  Denies any head injury.  She had left shoulder impact injury, complains of left shoulder pain since then.  Her pain is intermittent and is better now. She also has acute on chronic knee pain, but is better now. She is able to walk without any support currently. Denies any prodromal symptoms.  Dementia: She has been increasingly getting confused for the last few months. She has called 911 twice in the last month stating that her son is trying to harm her. She lives with her son and daughter-in-law currently. She is A&O X 2 today. Not aware of date and time. Does not recall President. Denies any focal neurologic deficit such as localized numbness or weakness. Denies any dysuria or hematuria.  HTN: Her BP was elevated today as she has not taken her lisinopril-HCTZ today. She takes medicines by herself and does not let her son help with it. Denies any chest pain, dyspnea or palpitations.  Type 2 DM: She takes Glipizide 10 mg BID. Her last HbA1c was 7.2 today.    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  Tobacco Use   Smoking status: Never   Smokeless tobacco: Never  Vaping Use   Vaping status: Never Used  Substance and Sexual Activity   Alcohol use: Never   Drug use: Never   Sexual activity: Not Currently  Other Topics Concern   Not on file  Social History Narrative   Lives with son- Tracy Salas       Dogs,  Licensed conveyancer, Snake       Enjoy: tv- stories; HGTV -house flipping       Diet: eats all food groups   Caffeine: 1 cup- daily if that    Water: 6-8 cups daily       Wears seat belt   Does not use phone while driving    Engineer, civil (consulting)- safe     Social Determinants of Health   Financial  Resource Strain: Low Risk  (12/03/2021)   Overall Financial Resource Strain (CARDIA)    Difficulty of Paying Living Expenses: Not hard at all  Food Insecurity: No Food Insecurity (12/03/2021)   Hunger Vital Sign    Worried About Running Out of Food in the Last Year: Never true    Ran Out of Food in the Last Year: Never true  Transportation Needs: No Transportation Needs (12/03/2021)   PRAPARE - Administrator, Civil Service (Medical): No    Lack of Transportation (Non-Medical): No  Physical Activity: Inactive (12/03/2021)   Exercise Vital Sign    Days of Exercise per Week: 0 days    Minutes of Exercise per Session: 0 min  Stress: No Stress Concern Present (12/03/2021)   [employer] of Occupational Health - Occupational Stress Questionnaire    Feeling of Stress : Not at all  Social Connections: Socially Isolated (12/03/2021)   Social Connection and Isolation Panel [NHANES]    Frequency of  Communication with Friends and Family: More than three times a week    Frequency of Social Gatherings with Friends and Family: Twice a week    Attends Religious Services: Never    Database administrator or Organizations: No    Attends Banker Meetings: Never    Marital Status: Widowed  Intimate Partner Violence: Not At Risk (12/03/2021)   Humiliation, Afraid, Rape, and Kick questionnaire    Fear of Current or Ex-Partner: No    Emotionally Abused: No    Physically Abused: No    Sexually Abused: No    Outpatient Medications Prior to Visit  Medication Sig Dispense Refill   ACCU-CHEK GUIDE test strip USE AS DIRECTED 100 strip 3   atorvastatin (LIPITOR) 10 MG tablet Take 1 tablet (10 mg total) by mouth daily. 90 tablet 3   blood glucose meter kit and supplies Dispense based on patient and insurance preference. Use once daily in morning before eating. (FOR ICD-10 E10.9, E11.9). 1 each 0   clotrimazole-betamethasone (LOTRISONE) cream Apply 1 application topically 2 (two) times daily. 30 g 3   glipiZIDE (GLUCOTROL) 10 MG tablet TAKE ONE TABLET BY MOUTH EVERY MORNING AND 1/2 TABLET EVERY EVENING WITH MEALS 90 tablet 1   glucose blood (ACCU-CHEK GUIDE) test strip Check once daily, morning fasting check is desired. 100 strip 3   lisinopril-hydrochlorothiazide (ZESTORETIC) 20-12.5 MG tablet Take 1 tablet by mouth daily. 90 tablet 3   ondansetron (ZOFRAN) 4 MG tablet Take 1 tablet (4 mg total) by mouth every 8 (eight) hours as needed for nausea or vomiting. 20 tablet 0   vitamin C (ASCORBIC ACID) 500 MG tablet Take 500 mg by mouth daily.     SPIKEVAX injection      No facility-administered medications prior to visit.    Allergies  Allergen Reactions   Penicillins Shortness Of Breath and Rash    Did it involve swelling of the face/tongue/throat, SOB, or low BP? Yes Did it involve sudden or severe rash/hives, skin peeling, or any reaction on the inside of your mouth or nose? Yes Did  you need to seek medical attention at a hospital or doctor's office? Unknown When did it last happen? over 48 years--72 years old      If all above answers are "NO", may proceed with cephalosporin use.    Metformin And Related Itching and Other (See Comments)    Shaking, altered mental status    ROS Review of Systems  Constitutional:  Negative  for chills and fever.  HENT:  Negative for congestion, sinus pressure, sinus pain and sore throat.   Eyes:  Negative for pain and discharge.  Respiratory:  Negative for cough and shortness of breath.   Cardiovascular:  Negative for chest pain and palpitations.  Gastrointestinal:  Negative for abdominal pain, diarrhea, nausea and vomiting.  Endocrine: Negative for polydipsia and polyuria.  Genitourinary:  Negative for dysuria and hematuria.  Musculoskeletal:  Positive for arthralgias. Negative for neck pain and neck stiffness.  Skin:  Negative for rash.  Neurological:  Negative for dizziness and weakness.  Psychiatric/Behavioral:  Negative for agitation and behavioral problems.       Objective:    Physical Exam Vitals reviewed.  Constitutional:      General: She is not in acute distress.    Appearance: She is obese. She is not diaphoretic.  HENT:     Head: Normocephalic and atraumatic.     Nose: Nose normal. No congestion.     Mouth/Throat:     Mouth: Mucous membranes are moist.     Pharynx: No posterior oropharyngeal erythema.  Eyes:     General: No scleral icterus.    Extraocular Movements: Extraocular movements intact.  Cardiovascular:     Rate and Rhythm: Normal rate and regular rhythm.     Heart sounds: Normal heart sounds. No murmur heard. Pulmonary:     Breath sounds: Normal breath sounds. No wheezing or rales.  Musculoskeletal:     Cervical back: Neck supple. No tenderness.     Right lower leg: No edema.     Left lower leg: No edema.  Skin:    General: Skin is warm.     Findings: No rash.  Neurological:     General:  No focal deficit present.     Mental Status: She is alert.     Cranial Nerves: No cranial nerve deficit.     Sensory: No sensory deficit.     Motor: No weakness.     Comments: Alert and oriented to person and place  Psychiatric:        Mood and Affect: Mood normal.        Behavior: Behavior normal.     BP (!) 176/86 (BP Location: Left Arm)   Pulse 98   Resp 16   Ht 5\' 2"  (1.575 m)   Wt 195 lb 9.6 oz (88.7 kg)   SpO2 90%   BMI 35.78 kg/m  Wt Readings from Last 3 Encounters:  07/27/23 195 lb 9.6 oz (88.7 kg)  09/17/22 212 lb 3.2 oz (96.3 kg)  01/08/22 215 lb (97.5 kg)    Lab Results  Component Value Date   TSH 1.130 11/29/2018   Lab Results  Component Value Date   WBC 12.2 (H) 09/17/2022   HGB 15.1 09/17/2022   HCT 45.0 09/17/2022   MCV 88 09/17/2022   PLT 349 09/17/2022   Lab Results  Component Value Date   NA 143 09/17/2022   K 4.5 09/17/2022   CO2 22 09/17/2022   GLUCOSE 185 (H) 09/17/2022   BUN 21 09/17/2022   CREATININE 1.20 (H) 09/17/2022   BILITOT 0.6 09/17/2022   ALKPHOS 118 09/17/2022   AST 19 09/17/2022   ALT 15 09/17/2022   PROT 7.3 09/17/2022   ALBUMIN 4.3 09/17/2022   CALCIUM 9.9 09/17/2022   ANIONGAP 11 11/29/2018   EGFR 48 (L) 09/17/2022   Lab Results  Component Value Date   CHOL 183 09/17/2022   Lab Results  Component Value Date   HDL 49 09/17/2022   Lab Results  Component Value Date   LDLCALC 114 (H) 09/17/2022   Lab Results  Component Value Date   TRIG 109 09/17/2022   Lab Results  Component Value Date   CHOLHDL 3.7 09/17/2022   Lab Results  Component Value Date   HGBA1C 7.5 (H) 09/17/2022      Assessment & Plan:   Problem List Items Addressed This Visit       Cardiovascular and Mediastinum   Essential hypertension - Primary    BP Readings from Last 1 Encounters:  07/27/23 (!) 176/86   Uncontrolled, but she has not taken her Zestoretic today Check BP after 2 weeks and advised to take medicine regularly -  referred to Home health for medication adherence counseling/guidance Counseled for compliance with the medications Advised DASH diet and moderate exercise/walking, at least 150 mins/week       Relevant Orders   CBC with Differential/Platelet   CMP14+EGFR   TSH   Ambulatory referral to Home Health     Endocrine   Type 2 diabetes mellitus with hyperglycemia (HCC)    Lab Results  Component Value Date   HGBA1C 7.5 (H) 09/17/2022   Well-controlled for her age On Glipizide 10 mg BID Advised to follow diabetic diet On statin and ACEi F/u CMP and HbA1c Diabetic eye exam: Advised to follow up with Ophthalmology for diabetic eye exam       Relevant Orders   CMP14+EGFR   Hemoglobin A1c   Ambulatory referral to Home Health     Nervous and Auditory   Moderate dementia (HCC)    A&O X 2 Plan to perform MoCA in the next visit She has episodes of confusion, likely moderate to severe dementia Check CBC, CMP, TSH, B12, UA Check MRI brain Referred to Neurology      Relevant Orders   MR Brain Wo Contrast   CBC with Differential/Platelet   UA/M w/rflx Culture, Routine   TSH   B12   Ambulatory referral to Neurology   Ambulatory referral to Home Health     Other   Fall    Likely mechanical No local swelling of left shoulder or right knee Tylenol PRN for pain       No orders of the defined types were placed in this encounter.   Follow-up: Return in about 2 weeks (around 08/10/2023).    Anabel Halon, MD

## 2023-07-27 NOTE — Patient Instructions (Addendum)
Please continue to take medications as prescribed.  Please continue to follow low carb diet and ambulate as tolerated.  You are being scheduled to get MRI of brain.  You are being referred to Neurology.

## 2023-07-27 NOTE — Assessment & Plan Note (Addendum)
BP Readings from Last 1 Encounters:  07/27/23 (!) 176/86   Uncontrolled, but she has not taken her Zestoretic today Check BP after 2 weeks and advised to take medicine regularly - referred to Home health for medication adherence counseling/guidance Counseled for compliance with the medications Advised DASH diet and moderate exercise/walking, at least 150 mins/week

## 2023-07-27 NOTE — Telephone Encounter (Signed)
Patient and son in office to see Dr Allena Katz today- they are wishing to switch her care from Dr Durwin Nora over to Dr Allena Katz due to feeling like Dr Allena Katz was a little more personable and her son says he likes how he goes into detail. Please advise on scheduling- recommended f/u is 2 weeks.  Thank you

## 2023-07-27 NOTE — Assessment & Plan Note (Signed)
Likely mechanical No local swelling of left shoulder or right knee Tylenol PRN for pain

## 2023-07-27 NOTE — Assessment & Plan Note (Signed)
A&O X 2 Plan to perform MoCA in the next visit She has episodes of confusion, likely moderate to severe dementia Check CBC, CMP, TSH, B12, UA Check MRI brain Referred to Neurology

## 2023-07-28 ENCOUNTER — Other Ambulatory Visit: Payer: Self-pay | Admitting: Internal Medicine

## 2023-07-28 DIAGNOSIS — N3000 Acute cystitis without hematuria: Secondary | ICD-10-CM

## 2023-07-28 MED ORDER — SULFAMETHOXAZOLE-TRIMETHOPRIM 800-160 MG PO TABS
1.0000 | ORAL_TABLET | Freq: Two times a day (BID) | ORAL | 0 refills | Status: AC
Start: 2023-07-28 — End: ?

## 2023-08-02 ENCOUNTER — Ambulatory Visit (HOSPITAL_COMMUNITY): Payer: Medicare Other

## 2023-08-02 LAB — CBC WITH DIFFERENTIAL/PLATELET
Basophils Absolute: 0.1 10*3/uL (ref 0.0–0.2)
Basos: 1 %
EOS (ABSOLUTE): 0.1 10*3/uL (ref 0.0–0.4)
Eos: 1 %
Hematocrit: 45 % (ref 34.0–46.6)
Hemoglobin: 14.9 g/dL (ref 11.1–15.9)
Immature Grans (Abs): 0 10*3/uL (ref 0.0–0.1)
Immature Granulocytes: 0 %
Lymphocytes Absolute: 1.6 10*3/uL (ref 0.7–3.1)
Lymphs: 15 %
MCH: 29.7 pg (ref 26.6–33.0)
MCHC: 33.1 g/dL (ref 31.5–35.7)
MCV: 90 fL (ref 79–97)
Monocytes Absolute: 0.8 10*3/uL (ref 0.1–0.9)
Monocytes: 7 %
Neutrophils Absolute: 8.1 10*3/uL — ABNORMAL HIGH (ref 1.4–7.0)
Neutrophils: 76 %
Platelets: 348 10*3/uL (ref 150–450)
RBC: 5.01 x10E6/uL (ref 3.77–5.28)
RDW: 12.3 % (ref 11.7–15.4)
WBC: 10.7 10*3/uL (ref 3.4–10.8)

## 2023-08-02 LAB — UA/M W/RFLX CULTURE, ROUTINE
Bilirubin, UA: NEGATIVE
Glucose, UA: NEGATIVE
Ketones, UA: NEGATIVE
Nitrite, UA: POSITIVE — AB
RBC, UA: NEGATIVE
Specific Gravity, UA: 1.023 (ref 1.005–1.030)
Urobilinogen, Ur: 0.2 mg/dL (ref 0.2–1.0)
pH, UA: 5 (ref 5.0–7.5)

## 2023-08-02 LAB — URINE CULTURE, REFLEX

## 2023-08-02 LAB — MICROSCOPIC EXAMINATION

## 2023-08-02 LAB — CMP14+EGFR
ALT: 13 IU/L (ref 0–32)
AST: 19 IU/L (ref 0–40)
Albumin: 4.3 g/dL (ref 3.8–4.8)
Alkaline Phosphatase: 123 IU/L — ABNORMAL HIGH (ref 44–121)
BUN/Creatinine Ratio: 25 (ref 12–28)
BUN: 29 mg/dL — ABNORMAL HIGH (ref 8–27)
Bilirubin Total: 0.6 mg/dL (ref 0.0–1.2)
CO2: 22 mmol/L (ref 20–29)
Calcium: 10 mg/dL (ref 8.7–10.3)
Chloride: 102 mmol/L (ref 96–106)
Creatinine, Ser: 1.16 mg/dL — ABNORMAL HIGH (ref 0.57–1.00)
Globulin, Total: 2.6 g/dL (ref 1.5–4.5)
Glucose: 185 mg/dL — ABNORMAL HIGH (ref 70–99)
Potassium: 4.1 mmol/L (ref 3.5–5.2)
Sodium: 143 mmol/L (ref 134–144)
Total Protein: 6.9 g/dL (ref 6.0–8.5)
eGFR: 50 mL/min/{1.73_m2} — ABNORMAL LOW (ref >59)

## 2023-08-02 LAB — HEMOGLOBIN A1C
Est. average glucose Bld gHb Est-mCnc: 160 mg/dL
Hgb A1c MFr Bld: 7.2 % — ABNORMAL HIGH (ref 4.8–5.6)

## 2023-08-02 LAB — TSH: TSH: 0.826 u[IU]/mL (ref 0.450–4.500)

## 2023-08-02 LAB — VITAMIN B12: Vitamin B-12: 354 pg/mL (ref 232–1245)

## 2023-08-03 ENCOUNTER — Ambulatory Visit: Payer: Medicare Other | Admitting: Internal Medicine

## 2023-08-09 ENCOUNTER — Ambulatory Visit (HOSPITAL_COMMUNITY)
Admission: RE | Admit: 2023-08-09 | Discharge: 2023-08-09 | Disposition: A | Discharge status: Home / Self Care | Payer: Medicare Other | Source: Ambulatory Visit | Attending: Internal Medicine | Admitting: Internal Medicine

## 2023-08-09 DIAGNOSIS — G8929 Other chronic pain: Secondary | ICD-10-CM | POA: Diagnosis not present

## 2023-08-09 DIAGNOSIS — Z9181 History of falling: Secondary | ICD-10-CM | POA: Diagnosis not present

## 2023-08-09 DIAGNOSIS — F03B18 Unspecified dementia, moderate, with other behavioral disturbance: Secondary | ICD-10-CM | POA: Insufficient documentation

## 2023-08-09 DIAGNOSIS — I10 Essential (primary) hypertension: Secondary | ICD-10-CM | POA: Diagnosis not present

## 2023-08-09 DIAGNOSIS — Z7984 Long term (current) use of oral hypoglycemic drugs: Secondary | ICD-10-CM | POA: Diagnosis not present

## 2023-08-09 DIAGNOSIS — E1165 Type 2 diabetes mellitus with hyperglycemia: Secondary | ICD-10-CM | POA: Diagnosis not present

## 2023-08-09 DIAGNOSIS — I6782 Cerebral ischemia: Secondary | ICD-10-CM | POA: Diagnosis not present

## 2023-08-09 DIAGNOSIS — E785 Hyperlipidemia, unspecified: Secondary | ICD-10-CM | POA: Diagnosis not present

## 2023-08-09 DIAGNOSIS — E559 Vitamin D deficiency, unspecified: Secondary | ICD-10-CM | POA: Diagnosis not present

## 2023-08-09 DIAGNOSIS — M25512 Pain in left shoulder: Secondary | ICD-10-CM | POA: Diagnosis not present

## 2023-08-09 DIAGNOSIS — M25561 Pain in right knee: Secondary | ICD-10-CM | POA: Diagnosis not present

## 2023-08-10 ENCOUNTER — Ambulatory Visit (HOSPITAL_COMMUNITY): Payer: Medicare Other

## 2023-08-12 DIAGNOSIS — I10 Essential (primary) hypertension: Secondary | ICD-10-CM | POA: Diagnosis not present

## 2023-08-12 DIAGNOSIS — E1165 Type 2 diabetes mellitus with hyperglycemia: Secondary | ICD-10-CM | POA: Diagnosis not present

## 2023-08-12 DIAGNOSIS — Z7984 Long term (current) use of oral hypoglycemic drugs: Secondary | ICD-10-CM | POA: Diagnosis not present

## 2023-08-12 DIAGNOSIS — M25512 Pain in left shoulder: Secondary | ICD-10-CM | POA: Diagnosis not present

## 2023-08-12 DIAGNOSIS — E785 Hyperlipidemia, unspecified: Secondary | ICD-10-CM | POA: Diagnosis not present

## 2023-08-12 DIAGNOSIS — E559 Vitamin D deficiency, unspecified: Secondary | ICD-10-CM | POA: Diagnosis not present

## 2023-08-12 DIAGNOSIS — G8929 Other chronic pain: Secondary | ICD-10-CM | POA: Diagnosis not present

## 2023-08-12 DIAGNOSIS — M25561 Pain in right knee: Secondary | ICD-10-CM | POA: Diagnosis not present

## 2023-08-12 DIAGNOSIS — Z9181 History of falling: Secondary | ICD-10-CM | POA: Diagnosis not present

## 2023-08-13 DIAGNOSIS — E559 Vitamin D deficiency, unspecified: Secondary | ICD-10-CM | POA: Diagnosis not present

## 2023-08-13 DIAGNOSIS — G8929 Other chronic pain: Secondary | ICD-10-CM | POA: Diagnosis not present

## 2023-08-13 DIAGNOSIS — I10 Essential (primary) hypertension: Secondary | ICD-10-CM | POA: Diagnosis not present

## 2023-08-13 DIAGNOSIS — E1165 Type 2 diabetes mellitus with hyperglycemia: Secondary | ICD-10-CM | POA: Diagnosis not present

## 2023-08-13 DIAGNOSIS — Z7984 Long term (current) use of oral hypoglycemic drugs: Secondary | ICD-10-CM | POA: Diagnosis not present

## 2023-08-13 DIAGNOSIS — Z9181 History of falling: Secondary | ICD-10-CM | POA: Diagnosis not present

## 2023-08-13 DIAGNOSIS — M25561 Pain in right knee: Secondary | ICD-10-CM | POA: Diagnosis not present

## 2023-08-13 DIAGNOSIS — M25512 Pain in left shoulder: Secondary | ICD-10-CM | POA: Diagnosis not present

## 2023-08-13 DIAGNOSIS — E785 Hyperlipidemia, unspecified: Secondary | ICD-10-CM | POA: Diagnosis not present

## 2023-08-16 ENCOUNTER — Telehealth: Payer: Self-pay | Admitting: Internal Medicine

## 2023-08-16 DIAGNOSIS — G8929 Other chronic pain: Secondary | ICD-10-CM | POA: Diagnosis not present

## 2023-08-16 DIAGNOSIS — I10 Essential (primary) hypertension: Secondary | ICD-10-CM | POA: Diagnosis not present

## 2023-08-16 DIAGNOSIS — Z9181 History of falling: Secondary | ICD-10-CM | POA: Diagnosis not present

## 2023-08-16 DIAGNOSIS — E559 Vitamin D deficiency, unspecified: Secondary | ICD-10-CM | POA: Diagnosis not present

## 2023-08-16 DIAGNOSIS — M25561 Pain in right knee: Secondary | ICD-10-CM | POA: Diagnosis not present

## 2023-08-16 DIAGNOSIS — Z7984 Long term (current) use of oral hypoglycemic drugs: Secondary | ICD-10-CM | POA: Diagnosis not present

## 2023-08-16 DIAGNOSIS — E1165 Type 2 diabetes mellitus with hyperglycemia: Secondary | ICD-10-CM | POA: Diagnosis not present

## 2023-08-16 DIAGNOSIS — E785 Hyperlipidemia, unspecified: Secondary | ICD-10-CM | POA: Diagnosis not present

## 2023-08-16 DIAGNOSIS — M25512 Pain in left shoulder: Secondary | ICD-10-CM | POA: Diagnosis not present

## 2023-08-16 NOTE — Telephone Encounter (Signed)
[facility] calling want orders for OT 1 week 7 Please advise, thank you

## 2023-08-17 ENCOUNTER — Ambulatory Visit: Payer: Medicare Other | Admitting: Internal Medicine

## 2023-08-17 ENCOUNTER — Encounter: Payer: Self-pay | Admitting: Internal Medicine

## 2023-08-17 VITALS — BP 138/70 | HR 96 | Ht 62.0 in | Wt 187.2 lb

## 2023-08-17 DIAGNOSIS — M25561 Pain in right knee: Secondary | ICD-10-CM | POA: Diagnosis not present

## 2023-08-17 DIAGNOSIS — M25512 Pain in left shoulder: Secondary | ICD-10-CM | POA: Diagnosis not present

## 2023-08-17 DIAGNOSIS — Z7984 Long term (current) use of oral hypoglycemic drugs: Secondary | ICD-10-CM

## 2023-08-17 DIAGNOSIS — Z23 Encounter for immunization: Secondary | ICD-10-CM

## 2023-08-17 DIAGNOSIS — F03B18 Unspecified dementia, moderate, with other behavioral disturbance: Secondary | ICD-10-CM | POA: Diagnosis not present

## 2023-08-17 DIAGNOSIS — R42 Dizziness and giddiness: Secondary | ICD-10-CM

## 2023-08-17 DIAGNOSIS — E785 Hyperlipidemia, unspecified: Secondary | ICD-10-CM | POA: Diagnosis not present

## 2023-08-17 DIAGNOSIS — E1165 Type 2 diabetes mellitus with hyperglycemia: Secondary | ICD-10-CM

## 2023-08-17 DIAGNOSIS — E559 Vitamin D deficiency, unspecified: Secondary | ICD-10-CM | POA: Diagnosis not present

## 2023-08-17 DIAGNOSIS — R269 Unspecified abnormalities of gait and mobility: Secondary | ICD-10-CM

## 2023-08-17 DIAGNOSIS — Z9181 History of falling: Secondary | ICD-10-CM | POA: Diagnosis not present

## 2023-08-17 DIAGNOSIS — G8929 Other chronic pain: Secondary | ICD-10-CM | POA: Diagnosis not present

## 2023-08-17 DIAGNOSIS — I10 Essential (primary) hypertension: Secondary | ICD-10-CM | POA: Diagnosis not present

## 2023-08-17 MED ORDER — DONEPEZIL HCL 5 MG PO TABS
5.0000 mg | ORAL_TABLET | Freq: Every day | ORAL | 1 refills | Status: DC
Start: 2023-08-17 — End: 2023-08-30

## 2023-08-17 MED ORDER — MECLIZINE HCL 25 MG PO TABS: 25.0000 mg | ORAL_TABLET | Freq: Three times a day (TID) | ORAL | 0 refills | Status: AC | PRN

## 2023-08-17 MED ORDER — MIRTAZAPINE 7.5 MG PO TABS
7.5000 mg | ORAL_TABLET | Freq: Every day | ORAL | 3 refills | Status: DC
Start: 2023-08-17 — End: 2023-08-30

## 2023-08-17 MED ORDER — MISC. DEVICES MISC
0 refills | Status: AC
Start: 2023-08-17 — End: ?

## 2023-08-17 NOTE — Assessment & Plan Note (Addendum)
Has chronic leg weakness, leading to gait disturbance Has been using cane, but still has fall risk Would benefit from using rolling walker, would benefit from bedside commode as well - sent prescription to [facility]

## 2023-08-17 NOTE — Assessment & Plan Note (Signed)
A&O X 2 MoCA: 14/30 She has episodes of confusion, likely moderate to severe dementia Checked CBC, CMP, TSH, B12, UA Check MRI brain - awaiting radiology read Started donepezil 5 mg nightly Started Remeron for agitation and insomnia Referred to Neurology

## 2023-08-17 NOTE — Progress Notes (Addendum)
Established Patient Office Visit  Subjective:  Patient ID: Tracy Salas, female    DOB: [birth year]  Age: 72 y.o. MRN: [id number]  CC:  Chief Complaint  Patient presents with   Dizziness    Patient has been dizzy for the past couple of days   Dementia    Confused at times    HPI Tracy Salas is a 72 y.o. female with past medical history of HTN, type II DM, HLD and dementia who presents for f/u of her chronic medical conditions.  Her son is present during the visit today.  Dementia: She has been increasingly getting confused for the last few months. She has called 911 twice in the last 2 months stating that her son was trying to harm her. She has been confused and agitated at times. She lives with her son and daughter-in-law currently. She is A&O X 2 today. Not aware of date and time. Does not recall President. Denies any focal neurologic deficit such as localized numbness or weakness. Denies any dysuria or hematuria. Her MoCA was 14/30 today.  HTN: Her BP was elevated today, but improved later during the visit. She has been taking lisinopril-HCTZ regularly.  Her son is helping with her medicines now.  Denies any chest pain, dyspnea or palpitations.  She still reports intermittent dizziness.  Type 2 DM: She takes Glipizide 10 mg BID. Her last HbA1c was 7.2 in 09/24. She does not check her blood glucose regularly.  Gait disturbance: She currently uses cane for walking support, but still has unsteady gait and has had falls.  She had home health evaluation, and was advised to use rolling walker for better support.  She would also benefit from bedside commode.  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  Tobacco Use   Smoking status: Never   Smokeless tobacco: Never  Vaping Use   Vaping status: Never Used  Substance and Sexual Activity   Alcohol use: Never   Drug use: Never   Sexual activity: Not Currently  Other Topics Concern   Not on file  Social History Narrative   Lives with son- [name]       Dogs,  Licensed conveyancer, Snake       Enjoy: tv- stories; HGTV -house flipping       Diet: eats all food groups   Caffeine: 1 cup- daily if that    Water: 6-8 cups daily       Wears seat belt   Does not use phone while driving    Engineer, civil (consulting)- safe     Social Determinants of Health   Financial Resource Strain: Low Risk  (12/03/2021)   Overall Financial Resource Strain (CARDIA)    Difficulty  of Paying Living Expenses: Not hard at all  Food Insecurity: No Food Insecurity (12/03/2021)   Hunger Vital Sign    Worried About Running Out of Food in the Last Year: Never true    Ran Out of Food in the Last Year: Never true  Transportation Needs: No Transportation Needs (12/03/2021)   PRAPARE - Administrator, Civil Service (Medical): No    Lack of Transportation (Non-Medical): No  Physical Activity: Inactive (12/03/2021)   Exercise Vital Sign    Days of Exercise per Week: 0 days    Minutes of Exercise per Session: 0 min  Stress: No Stress Concern Present (12/03/2021)   [employer] of Occupational Health - Occupational Stress Questionnaire    Feeling of Stress : Not at all  Social Connections: Socially Isolated (12/03/2021)   Social Connection and Isolation Panel [NHANES]    Frequency of Communication with  Friends and Family: More than three times a week    Frequency of Social Gatherings with Friends and Family: Twice a week    Attends Religious Services: Never    Database administrator or Organizations: No    Attends Banker Meetings: Never    Marital Status: Widowed  Intimate Partner Violence: Not At Risk (12/03/2021)   Humiliation, Afraid, Rape, and Kick questionnaire    Fear of Current or Ex-Partner: No    Emotionally Abused: No    Physically Abused: No    Sexually Abused: No    Outpatient Medications Prior to Visit  Medication Sig Dispense Refill   ACCU-CHEK GUIDE test strip USE AS DIRECTED 100 strip 3   atorvastatin (LIPITOR) 10 MG tablet Take 1 tablet (10 mg total) by mouth daily. 90 tablet 3   blood glucose meter kit and supplies Dispense based on patient and insurance preference. Use once daily in morning before eating. (FOR ICD-10 E10.9, E11.9). 1 each 0   clotrimazole-betamethasone (LOTRISONE) cream Apply 1 application topically 2 (two) times daily. 30 g 3   glipiZIDE (GLUCOTROL) 10 MG tablet TAKE ONE TABLET BY MOUTH EVERY MORNING AND 1/2 TABLET EVERY EVENING WITH MEALS 90 tablet 1   glucose blood (ACCU-CHEK GUIDE) test strip Check once daily, morning fasting check is desired. 100 strip 3   lisinopril-hydrochlorothiazide (ZESTORETIC) 20-12.5 MG tablet Take 1 tablet by mouth daily. 90 tablet 3   ondansetron (ZOFRAN) 4 MG tablet Take 1 tablet (4 mg total) by mouth every 8 (eight) hours as needed for nausea or vomiting. 20 tablet 0   vitamin C (ASCORBIC ACID) 500 MG tablet Take 500 mg by mouth daily.     sulfamethoxazole-trimethoprim (BACTRIM DS) 800-160 MG tablet Take 1 tablet by mouth 2 (two) times daily. 10 tablet 0   No facility-administered medications prior to visit.    Allergies  Allergen Reactions   Penicillins Shortness Of Breath and Rash    Did it involve swelling of the face/tongue/throat, SOB, or low BP? Yes Did it involve sudden or severe rash/hives,  skin peeling, or any reaction on the inside of your mouth or nose? Yes Did you need to seek medical attention at a hospital or doctor's office? Unknown When did it last happen? over 84 years--72 years old      If all above answers are "NO", may proceed with cephalosporin use.    Metformin And Related Itching and Other (See Comments)    Shaking, altered mental status    ROS Review of Systems  Constitutional:  Negative for chills and fever.  HENT:  Negative for congestion, sinus pressure, sinus pain and sore throat.   Eyes:  Negative for pain and discharge.  Respiratory:  Negative for cough and shortness of breath.   Cardiovascular:  Negative for chest pain and palpitations.  Gastrointestinal:  Negative for abdominal pain, diarrhea, nausea and vomiting.  Endocrine: Negative for polydipsia and polyuria.  Genitourinary:  Negative for dysuria and hematuria.  Musculoskeletal:  Positive for arthralgias. Negative for neck pain and neck stiffness.  Skin:  Negative for rash.  Neurological:  Negative for dizziness and weakness.  Psychiatric/Behavioral:  Positive for confusion, decreased concentration and sleep disturbance. Negative for agitation and behavioral problems.       Objective:    Physical Exam Vitals reviewed.  Constitutional:      General: She is not in acute distress.    Appearance: She is obese. She is not diaphoretic.  HENT:     Head: Normocephalic and atraumatic.     Nose: Nose normal. No congestion.     Mouth/Throat:     Mouth: Mucous membranes are moist.     Pharynx: No posterior oropharyngeal erythema.  Eyes:     General: No scleral icterus.    Extraocular Movements: Extraocular movements intact.  Cardiovascular:     Rate and Rhythm: Normal rate and regular rhythm.     Heart sounds: Normal heart sounds. No murmur heard. Pulmonary:     Breath sounds: Normal breath sounds. No wheezing or rales.  Musculoskeletal:     Cervical back: Neck supple. No tenderness.      Right lower leg: No edema.     Left lower leg: No edema.  Skin:    General: Skin is warm.     Findings: No rash.  Neurological:     General: No focal deficit present.     Mental Status: She is alert.     Cranial Nerves: No cranial nerve deficit.     Sensory: No sensory deficit.     Motor: Weakness (B/l LE - 3/5) present.     Comments: Alert and oriented to person and place  Psychiatric:        Mood and Affect: Mood normal.        Behavior: Behavior is cooperative.     BP 138/70 (BP Location: Right Arm)   Pulse 96   Ht 5\' 2"  (1.575 m)   Wt 187 lb 3.2 oz (84.9 kg)   SpO2 91%   BMI 34.24 kg/m  Wt Readings from Last 3 Encounters:  08/17/23 187 lb 3.2 oz (84.9 kg)  07/27/23 195 lb 9.6 oz (88.7 kg)  09/17/22 212 lb 3.2 oz (96.3 kg)    Lab Results  Component Value Date   TSH 0.826 07/27/2023   Lab Results  Component Value Date   WBC 10.7 07/27/2023   HGB 14.9 07/27/2023   HCT 45.0 07/27/2023   MCV 90 07/27/2023   PLT 348 07/27/2023   Lab Results  Component Value Date   NA 143 07/27/2023   K 4.1 07/27/2023   CO2 22 07/27/2023   GLUCOSE 185 (H) 07/27/2023   BUN 29 (H) 07/27/2023   CREATININE 1.16 (H) 07/27/2023   BILITOT 0.6 07/27/2023   ALKPHOS 123 (H) 07/27/2023   AST 19 07/27/2023   ALT 13 07/27/2023   PROT 6.9 07/27/2023   ALBUMIN 4.3 07/27/2023   CALCIUM 10.0 07/27/2023   ANIONGAP 11 11/29/2018   EGFR 50 (L) 07/27/2023   Lab Results  Component Value Date   CHOL 183  09/17/2022   Lab Results  Component Value Date   HDL 49 09/17/2022   Lab Results  Component Value Date   LDLCALC 114 (H) 09/17/2022   Lab Results  Component Value Date   TRIG 109 09/17/2022   Lab Results  Component Value Date   CHOLHDL 3.7 09/17/2022   Lab Results  Component Value Date   HGBA1C 7.2 (H) 07/27/2023      Assessment & Plan:   Problem List Items Addressed This Visit       Cardiovascular and Mediastinum   Essential hypertension    BP Readings from Last 1  Encounters:  08/17/23 138/70   Well controlled for her age, on Zestoretic Advised to take medicine regularly - has been referred to Home health for medication adherence counseling/guidance Counseled for compliance with the medications Advised DASH diet and moderate exercise/walking, at least 150 mins/week         Endocrine   Type 2 diabetes mellitus with hyperglycemia (HCC)    Lab Results  Component Value Date   HGBA1C 7.2 (H) 07/27/2023   Well-controlled for her age now On Glipizide 10 mg BID Advised to follow diabetic diet On statin and ACEi F/u CMP and HbA1c Diabetic eye exam: Advised to follow up with Ophthalmology for diabetic eye exam        Nervous and Auditory   Moderate dementia (HCC) - Primary    A&O X 2 MoCA: 14/30 She has episodes of confusion, likely moderate to severe dementia Checked CBC, CMP, TSH, B12, UA Check MRI brain - awaiting radiology read Started donepezil 5 mg nightly Started Remeron for agitation and insomnia Referred to Neurology      Relevant Medications   donepezil (ARICEPT) 5 MG tablet   mirtazapine (REMERON) 7.5 MG tablet     Other   Dizziness    Has chronic dizziness Unclear etiology, but dehydration is a likely reason considering her elevated serum creatinine Advised to maintain adequate hydration and eat at regular intervals Meclizine as needed for dizziness, could be from vertigo      Gait disturbance    Has chronic leg weakness, leading to gait disturbance Has been using cane, but still has fall risk Would benefit from using rolling walker, would benefit from bedside commode as well - sent prescription to [facility]      Relevant Medications   Misc. Devices MISC   Other Relevant Orders   DME Bedside commode   For home use only DME 4 wheeled rolling walker with seat ([id number])   Other Visit Diagnoses     Vertigo       Relevant Medications   meclizine (ANTIVERT) 25 MG tablet   Encounter for immunization        Relevant Orders   Flu Vaccine Trivalent High Dose (Fluad) (Completed)        Meds ordered this encounter  Medications   donepezil (ARICEPT) 5 MG tablet    Sig: Take 1 tablet (5 mg total) by mouth at bedtime.    Dispense:  90 tablet    Refill:  1   mirtazapine (REMERON) 7.5 MG tablet    Sig: Take 1 tablet (7.5 mg total) by mouth at bedtime.    Dispense:  30 tablet    Refill:  3   meclizine (ANTIVERT) 25 MG tablet    Sig: Take 1 tablet (25 mg total) by mouth 3 (three) times daily as needed for dizziness.    Dispense:  30 tablet    Refill:  0   Misc. Devices MISC    Sig: Blood pressure cuff/device - 1. ICD10: I10    Dispense:  1 each    Refill:  0    Follow-up: Return in about 3 months (around 11/17/2023) for DM and dementia.    Anabel Halon, MD

## 2023-08-17 NOTE — Assessment & Plan Note (Addendum)
BP Readings from Last 1 Encounters:  08/17/23 138/70   Well controlled for her age, on Zestoretic Advised to take medicine regularly - has been referred to Home health for medication adherence counseling/guidance Counseled for compliance with the medications Advised DASH diet and moderate exercise/walking, at least 150 mins/week

## 2023-08-17 NOTE — Assessment & Plan Note (Signed)
Has chronic dizziness Unclear etiology, but dehydration is a likely reason considering her elevated serum creatinine Advised to maintain adequate hydration and eat at regular intervals Meclizine as needed for dizziness, could be from vertigo

## 2023-08-17 NOTE — Addendum Note (Signed)
Addended byTrena Platt on: 08/17/2023 06:25 PM   Modules accepted: Orders

## 2023-08-17 NOTE — Assessment & Plan Note (Signed)
Lab Results  Component Value Date   HGBA1C 7.2 (H) 07/27/2023   Well-controlled for her age now On Glipizide 10 mg BID Advised to follow diabetic diet On statin and ACEi F/u CMP and HbA1c Diabetic eye exam: Advised to follow up with Ophthalmology for diabetic eye exam

## 2023-08-17 NOTE — Patient Instructions (Addendum)
Please start taking Donepezil and Mirtazepine as prescribed.  Please take Meclizine as needed for dizziness.  Please maintain at least 64 ounces of fluid intake in a day and eat at regular intervals.

## 2023-08-18 DIAGNOSIS — Z9181 History of falling: Secondary | ICD-10-CM | POA: Diagnosis not present

## 2023-08-18 DIAGNOSIS — M25512 Pain in left shoulder: Secondary | ICD-10-CM | POA: Diagnosis not present

## 2023-08-18 DIAGNOSIS — E1165 Type 2 diabetes mellitus with hyperglycemia: Secondary | ICD-10-CM | POA: Diagnosis not present

## 2023-08-18 DIAGNOSIS — G8929 Other chronic pain: Secondary | ICD-10-CM | POA: Diagnosis not present

## 2023-08-18 DIAGNOSIS — M25561 Pain in right knee: Secondary | ICD-10-CM | POA: Diagnosis not present

## 2023-08-18 DIAGNOSIS — I10 Essential (primary) hypertension: Secondary | ICD-10-CM | POA: Diagnosis not present

## 2023-08-18 DIAGNOSIS — Z7984 Long term (current) use of oral hypoglycemic drugs: Secondary | ICD-10-CM | POA: Diagnosis not present

## 2023-08-18 DIAGNOSIS — E559 Vitamin D deficiency, unspecified: Secondary | ICD-10-CM | POA: Diagnosis not present

## 2023-08-18 DIAGNOSIS — E785 Hyperlipidemia, unspecified: Secondary | ICD-10-CM | POA: Diagnosis not present

## 2023-08-19 ENCOUNTER — Telehealth: Payer: Self-pay | Admitting: Internal Medicine

## 2023-08-19 NOTE — Telephone Encounter (Signed)
Tracy Salas advised

## 2023-08-19 NOTE — Telephone Encounter (Signed)
Maurice w. [facility] home [facility] called in on patient behalf   Pt requested DC from PT  Call back [phone]

## 2023-08-22 ENCOUNTER — Telehealth: Payer: Self-pay | Admitting: Internal Medicine

## 2023-08-22 NOTE — Telephone Encounter (Signed)
Son advised

## 2023-08-22 NOTE — Telephone Encounter (Signed)
Patient son [name] called concern about his mom.  Her mind is getting worse far and seeing things and making up things that never happen, concern could it be from the medicine.?  Around 2:00 or 3:00 pm seems worse for her.   Call back # [phone]

## 2023-08-23 ENCOUNTER — Other Ambulatory Visit: Payer: Self-pay | Admitting: Internal Medicine

## 2023-08-23 DIAGNOSIS — I10 Essential (primary) hypertension: Secondary | ICD-10-CM | POA: Diagnosis not present

## 2023-08-23 DIAGNOSIS — E559 Vitamin D deficiency, unspecified: Secondary | ICD-10-CM | POA: Diagnosis not present

## 2023-08-23 DIAGNOSIS — Z9181 History of falling: Secondary | ICD-10-CM | POA: Diagnosis not present

## 2023-08-23 DIAGNOSIS — G8929 Other chronic pain: Secondary | ICD-10-CM | POA: Diagnosis not present

## 2023-08-23 DIAGNOSIS — M25561 Pain in right knee: Secondary | ICD-10-CM | POA: Diagnosis not present

## 2023-08-23 DIAGNOSIS — E785 Hyperlipidemia, unspecified: Secondary | ICD-10-CM | POA: Diagnosis not present

## 2023-08-23 DIAGNOSIS — Z7984 Long term (current) use of oral hypoglycemic drugs: Secondary | ICD-10-CM | POA: Diagnosis not present

## 2023-08-23 DIAGNOSIS — M25512 Pain in left shoulder: Secondary | ICD-10-CM | POA: Diagnosis not present

## 2023-08-23 DIAGNOSIS — E1165 Type 2 diabetes mellitus with hyperglycemia: Secondary | ICD-10-CM | POA: Diagnosis not present

## 2023-08-30 ENCOUNTER — Telehealth: Payer: Self-pay | Admitting: Internal Medicine

## 2023-08-30 ENCOUNTER — Encounter: Payer: Self-pay | Admitting: Neurology

## 2023-08-30 ENCOUNTER — Ambulatory Visit: Payer: Medicare Other | Admitting: Neurology

## 2023-08-30 VITALS — BP 148/74 | HR 80 | Ht 60.0 in | Wt 184.0 lb

## 2023-08-30 DIAGNOSIS — Z7984 Long term (current) use of oral hypoglycemic drugs: Secondary | ICD-10-CM | POA: Diagnosis not present

## 2023-08-30 DIAGNOSIS — E1165 Type 2 diabetes mellitus with hyperglycemia: Secondary | ICD-10-CM

## 2023-08-30 DIAGNOSIS — G8929 Other chronic pain: Secondary | ICD-10-CM | POA: Diagnosis not present

## 2023-08-30 DIAGNOSIS — F03A0 Unspecified dementia, mild, without behavioral disturbance, psychotic disturbance, mood disturbance, and anxiety: Secondary | ICD-10-CM

## 2023-08-30 DIAGNOSIS — M25562 Pain in left knee: Secondary | ICD-10-CM | POA: Diagnosis not present

## 2023-08-30 DIAGNOSIS — M25561 Pain in right knee: Secondary | ICD-10-CM | POA: Diagnosis not present

## 2023-08-30 MED ORDER — GALANTAMINE HYDROBROMIDE 8 MG PO TABS: 8.0000 mg | ORAL_TABLET | Freq: Two times a day (BID) | ORAL | 11 refills | Status: DC

## 2023-08-30 MED ORDER — HYDROXYZINE HCL 10 MG PO TABS: ORAL_TABLET | ORAL | 5 refills | Status: AC

## 2023-08-30 NOTE — Telephone Encounter (Signed)
Spoke to [employer]

## 2023-08-30 NOTE — Progress Notes (Signed)
[facility]  PATIENT: Tracy Salas DOB: [birth year]  REFERRING DOCTOR OR PCP:  Trena Platt, MD SOURCE: Patient, notes from primary care, imaging and lab reports, MRI images personally reviewed.  _________________________________   HISTORICAL  CHIEF COMPLAINT:  Chief Complaint  Patient presents with   Room 10    Pt is here with her Son. Pt's son that the last 2 months have been pretty bad with her memory. Pt's son states that more of the short term memory is declining more than the long term. Pt states that she gets irritated with her son. Pt has a history of Alzheimer and Dementia.      HISTORY OF PRESENT ILLNESS:  I had the pleasure of seeing a patient, Tracy Salas, at [facility] for neurologic consultation regarding her cognitive concerns.  She is accompanied by her son who added additional information  She is a 72 year old woman with cognitive decline.   She feels she is doing farly well without major issues but family notes more difficulty.    She no longer drives (no event, just felt safer by family).  She n longer shops (she stated due to knee issues).   She has difficulty with dates.  She states she does her checkbook.   She rarely gets out of her house and spends much of her day watching TV and playing with her dog.  She does not have any hobbies.    Her soe reports, the family first noted mild memory issues 2 years ago that slowly progressed.   Two months ago, she woke up not knowing her son and D-I-L ad she called the [facility].   She was similar the next day and the family contacted their PCP.    He reports that she is having more difficulty with some simple tasks like balancing her checkbook though she does not want her family to do it for her.  Donepezil was tried.  However, even on the low-dose of 5 mg the son felt that her cognition worsened and it was discontinued.  Her mother had dementia.   Her husband had Lewy Body  dementia.       She has a poor gait but notes bilateral knee issues and she uses a cane.   Sometimes she feels dizzy but has never had syncope or LOC.   Hands/arms are fine.    She has urge incontinence a couple times a week.   She is supposed to wear glasses but does not always use.   Hearing is fine.       08/30/2023    8:50 AM 08/17/2023    3:44 PM  Montreal Cognitive Assessment   Visuospatial/ Executive (0/5) 3 4  Naming (0/3) 3 3  Attention: Read list of digits (0/2) 2 2  Attention: Read list of letters (0/1) 1 1  Attention: Serial 7 subtraction starting at 100 (0/3) 0 0  Language: Repeat phrase (0/2) 2 1  Language : Fluency (0/1) 0 1  Abstraction (0/2) 2 0  Delayed Recall (0/5) 0 0  Orientation (0/6) 3 2  Total 16 14  Adjusted Score (based on education) 17     Lab work from 07/27/2023 showed normal B12, normal TSH elevated hemoglobin A1c (7.2, she has DM)  MRI of the brain 08/09/2023 was personally reviewed.  She has mild atrophy, slightly more in the left temporal region compared to elsewhere.  There is mild to moderate chronic microvascular ischemic change.  No acute findings.  Examined a few things this/patient low back/  Vascular risk factors: Hypertension, diabetes.  She was never a smoker.   Constitutional: No fevers, chills, sweats, or change in appetite Eyes: No visual changes, double vision, eye pain Ear, nose and throat: No hearing loss, ear pain, nasal congestion, sore throat Cardiovascular: No chest pain, palpitations Respiratory:  No shortness of breath at rest or with exertion.   No wheezes GastrointestinaI: No nausea, vomiting, diarrhea, abdominal pain, fecal incontinence Genitourinary:  No dysuria, urinary retention or frequency.  No nocturia. Musculoskeletal: She reports severe knee pain.  Mild back pain.   Integumentary: No rash, pruritus, skin lesions Neurological: as above Psychiatric: No depression at this time.  No anxiety Endocrine: No  palpitations, diaphoresis, change in appetite, change in weigh or increased thirst.  She has diabetes. Hematologic/Lymphatic:  No anemia, purpura, petechiae. Allergic/Immunologic: No itchy/runny eyes, nasal congestion, recent allergic reactions, rashes  ALLERGIES: Allergies  Allergen Reactions   Penicillins Shortness Of Breath and Rash    Did it involve swelling of the face/tongue/throat, SOB, or low BP? Yes Did it involve sudden or severe rash/hives, skin peeling, or any reaction on the inside of your mouth or nose? Yes Did you need to seek medical attention at a hospital or doctor's office? Unknown When did it last happen? over 80 years--72 years old      If all above answers are "NO", may proceed with cephalosporin use.    Donepezil Other (See Comments)    Made pt Aggressive    Metformin And Related Itching and Other (See Comments)    Shaking, altered mental status    HOME MEDICATIONS:  Current Outpatient Medications:    ACCU-CHEK GUIDE test strip, USE AS DIRECTED, Disp: 100 strip, Rfl: 3   atorvastatin (LIPITOR) 10 MG tablet, Take 1 tablet (10 mg total) by mouth daily., Disp: 90 tablet, Rfl: 3   blood glucose meter kit and supplies, Dispense based on patient and insurance preference. Use once daily in morning before eating. (FOR ICD-10 E10.9, E11.9)., Disp: 1 each, Rfl: 0   clotrimazole-betamethasone (LOTRISONE) cream, Apply 1 application topically 2 (two) times daily., Disp: 30 g, Rfl: 3   galantamine (RAZADYNE) 8 MG tablet, Take 1 tablet (8 mg total) by mouth 2 (two) times daily., Disp: 30 tablet, Rfl: 11   glipiZIDE (GLUCOTROL) 10 MG tablet, TAKE ONE TABLET BY MOUTH EVERY MORNING AND 1/2 TABLET EVERY EVENING WITH MEALS, Disp: 90 tablet, Rfl: 1   glucose blood (ACCU-CHEK GUIDE) test strip, Check once daily, morning fasting check is desired., Disp: 100 strip, Rfl: 3   hydrOXYzine (ATARAX) 10 MG tablet, Take one po every day prn agiatation, Disp: 30 tablet, Rfl: 5    lisinopril-hydrochlorothiazide (ZESTORETIC) 20-12.5 MG tablet, Take 1 tablet by mouth daily., Disp: 90 tablet, Rfl: 3   Misc. Devices MISC, Blood pressure cuff/device - 1. ICD10: I10, Disp: 1 each, Rfl: 0   ondansetron (ZOFRAN) 4 MG tablet, Take 1 tablet (4 mg total) by mouth every 8 (eight) hours as needed for nausea or vomiting., Disp: 20 tablet, Rfl: 0   vitamin C (ASCORBIC ACID) 500 MG tablet, Take 500 mg by mouth daily., Disp: , Rfl:    meclizine (ANTIVERT) 25 MG tablet, Take 1 tablet (25 mg total) by mouth 3 (three) times daily as needed for dizziness. (Patient not taking: Reported on 08/30/2023), Disp: 30 tablet, Rfl: 0   mirtazapine (REMERON) 7.5 MG tablet, Take 1 tablet (7.5 mg total) by mouth at bedtime. (Patient not taking: Reported  on 08/30/2023), Disp: 30 tablet, Rfl: 3  PAST MEDICAL HISTORY: Past Medical History:  Diagnosis Date   Dermatitis, unspecified 03/10/2020   Diabetes mellitus without complication (HCC)    Hypertension    Vitamin D deficiency     PAST SURGICAL HISTORY: Past Surgical History:  Procedure Laterality Date   CHOLECYSTECTOMY     TUMOR REMOVAL  2018    FAMILY HISTORY: Family History  Problem Relation Age of Onset   Alzheimer's disease Mother    Stroke Father     SOCIAL HISTORY: Social History   Socioeconomic History   Marital status: Widowed    Spouse name: Not on file   Number of children: 1   Years of education: Not on file   Highest education level: Not on file  Occupational History   Occupation: retired    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  Tobacco Use   Smoking status: Never   Smokeless tobacco: Never  Vaping Use   Vaping status: Never Used  Substance and Sexual Activity   Alcohol use: Never   Drug use: Never   Sexual activity: Not Currently  Other Topics Concern   Not on file  Social History Narrative   Lives with son- [name]       Dogs,  Licensed conveyancer, Snake       Enjoy: tv- stories; HGTV -house flipping       Diet:  eats all food groups   Caffeine: 1 cup- daily if that    Water: 6-8 cups daily       Wears seat belt   Does not use phone while driving    Engineer, civil (consulting)- safe     Social Determinants of Health   Financial Resource Strain: Low Risk  (12/03/2021)   Overall Financial Resource Strain (CARDIA)    Difficulty of Paying Living Expenses: Not hard at all  Food Insecurity: No Food Insecurity (12/03/2021)   Hunger Vital Sign    Worried About Running Out of Food in the Last Year: Never true    Ran Out of Food in the Last Year: Never true  Transportation Needs: No Transportation Needs (12/03/2021)   PRAPARE - Administrator, Civil Service (Medical): No    Lack of Transportation (Non-Medical): No  Physical Activity: Inactive (12/03/2021)   Exercise Vital Sign    Days of Exercise per Week: 0 days    Minutes of Exercise per Session: 0 min  Stress: No Stress Concern Present (12/03/2021)   [employer] of Occupational Health - Occupational Stress Questionnaire    Feeling of Stress : Not at all  Social Connections: Socially Isolated (12/03/2021)   Social Connection and Isolation Panel [NHANES]    Frequency of Communication with Friends and Family: More than three times a week    Frequency of Social Gatherings with Friends and Family: Twice a week    Attends Religious Services: Never    Database administrator or Organizations: No    Attends Banker Meetings: Never    Marital Status: Widowed  Intimate Partner Violence: Not At Risk (12/03/2021)   Humiliation, Afraid, Rape, and Kick questionnaire    Fear of Current or Ex-Partner: No    Emotionally Abused: No    Physically Abused: No    Sexually Abused: No       PHYSICAL EXAM  Vitals:   08/30/23 0847  BP: (!) 148/74  Pulse: 80  Weight: 184 lb (83.5  kg)  Height: 5' (1.524 m)    Body mass index is 35.94 kg/m.   General: The patient is well-developed and well-nourished and in  no acute distress  HEENT:  Head is [employer]/AT.  Sclera are anicteric.    Neck: No carotid bruits are noted.  The neck is nontender.  Cardiovascular: The heart has a regular rate and rhythm with a normal S1 and S2. There were no murmurs, gallops or rubs.    Skin: Extremities are without rash or  edema.  Musculoskeletal:  Back is nontender  Neurologic Exam  Mental status: The patient is alert and oriented x 2 at the time of the examination she scored 17/30 on the [facility] cognitive assessment (mild dementia) and missed all 5 points for short-term recall.  Attention was reduced.  Speech and language are normal.  Cranial nerves: Extraocular movements are full. Pupils are equal, round, and reactive to light and accomodation.  Visual fields are full.  Facial symmetry is present. There is good facial sensation to soft touch bilaterally.Facial strength is normal.  Trapezius and sternocleidomastoid strength is normal. No dysarthria is noted.  The tongue is midline, and the patient has symmetric elevation of the soft palate. No obvious hearing deficits are noted.  Motor:  Muscle bulk is normal.   Tone is normal. Strength is  5 / 5 in all 4 extremities.  Except 4+/5 toe extension.  Sensory: Sensory testing is intact to pinprick, soft touch and vibration sensation in all 4 extremities.  Coordination: Cerebellar testing reveals good finger-nose-finger and heel-to-shin bilaterally.  Gait and station: Station is normal.   She does use a chair to help her arise.  Her gait is very arthritic.  Reduced stride and wide base.  She cannot tandem.  Romberg was not tested due to the poor gait.  Reflexes: Deep tendon reflexes are 1 and symmetric  bilaterally.   Plantar responses are flexor.    DIAGNOSTIC DATA (LABS, IMAGING, TESTING) - I reviewed patient records, labs, notes, testing and imaging myself where available.  Lab Results  Component Value Date   WBC 10.7 07/27/2023   HGB 14.9 07/27/2023   HCT 45.0  07/27/2023   MCV 90 07/27/2023   PLT 348 07/27/2023      Component Value Date/Time   NA 143 07/27/2023 1040   K 4.1 07/27/2023 1040   CL 102 07/27/2023 1040   CO2 22 07/27/2023 1040   GLUCOSE 185 (H) 07/27/2023 1040   GLUCOSE 196 (H) 11/29/2018 1415   BUN 29 (H) 07/27/2023 1040   CREATININE 1.16 (H) 07/27/2023 1040   CALCIUM 10.0 07/27/2023 1040   PROT 6.9 07/27/2023 1040   ALBUMIN 4.3 07/27/2023 1040   AST 19 07/27/2023 1040   ALT 13 07/27/2023 1040   ALKPHOS 123 (H) 07/27/2023 1040   BILITOT 0.6 07/27/2023 1040   GFRNONAA 63 08/03/2019 0000   GFRAA 73 08/03/2019 0000   Lab Results  Component Value Date   CHOL 183 09/17/2022   HDL 49 09/17/2022   LDLCALC 114 (H) 09/17/2022   TRIG 109 09/17/2022   CHOLHDL 3.7 09/17/2022   Lab Results  Component Value Date   HGBA1C 7.2 (H) 07/27/2023   Lab Results  Component Value Date   VITAMINB12 354 07/27/2023   Lab Results  Component Value Date   TSH 0.826 07/27/2023       ASSESSMENT AND PLAN  Mild dementia, unspecified dementia type, unspecified whether behavioral, psychotic, or mood disturbance or anxiety (HCC) - Plan: [facility] PROFILE  Chronic pain of both knees  Type 2 diabetes mellitus with hyperglycemia, without long-term current use of insulin (HCC)   In summary, Tracy Salas is a 72 year old woman with mild dementia that has progressed over the last 2 years.  I am most concerned about the possibility of Alzheimer's disease given her performance on the [facility] cognitive assessment and missing all 5 points for short-term recall.  She does have some white matter changes on the MRI and that could be some component of vascular overlap.  Her neurologic exam was fairly normal.  Poor performance on gait was more likely due to her severe knee pain than a neurologic concern.  I have ordered amyloid 42/40 ratio's and [id number] levels.  These biomarkers can help to diagnose Alzheimer's disease.  She had not been able to tolerate  donepezil and I will have her try galantamine.  We will increase the dose if tolerated.  If not tolerated, consider a switch to memantine.  I also advised taking 1 vitamin a day and trying to eat better and lose weight and stay more physically and mentally active  They will return to see me in 4 months or sooner if there are new or worsening neurologic symptoms.  Thank you for asking to see administrator.  Please let me know if I can be of further assistance with her or other patients in the future.  This visit is part of a comprehensive longitudinal care medical relationship regarding the patients primary diagnosis of dementia and related concerns.  Tracy Salas A. Epimenio Foot, MD, [facility] 08/30/2023, 12:54 PM Certified in Neurology, Clinical Neurophysiology, Sleep Medicine and Neuroimaging  [facility] [address], [address] [place], [place] [address] [phone]

## 2023-08-30 NOTE — Telephone Encounter (Signed)
Marcelino Duster the occupational therapist called from [facility] need plan of care for patient callback # [phone].

## 2023-09-01 DIAGNOSIS — M25561 Pain in right knee: Secondary | ICD-10-CM | POA: Diagnosis not present

## 2023-09-01 DIAGNOSIS — E559 Vitamin D deficiency, unspecified: Secondary | ICD-10-CM | POA: Diagnosis not present

## 2023-09-01 DIAGNOSIS — Z7984 Long term (current) use of oral hypoglycemic drugs: Secondary | ICD-10-CM | POA: Diagnosis not present

## 2023-09-01 DIAGNOSIS — M25512 Pain in left shoulder: Secondary | ICD-10-CM | POA: Diagnosis not present

## 2023-09-01 DIAGNOSIS — E785 Hyperlipidemia, unspecified: Secondary | ICD-10-CM | POA: Diagnosis not present

## 2023-09-01 DIAGNOSIS — G8929 Other chronic pain: Secondary | ICD-10-CM | POA: Diagnosis not present

## 2023-09-01 DIAGNOSIS — Z9181 History of falling: Secondary | ICD-10-CM | POA: Diagnosis not present

## 2023-09-01 DIAGNOSIS — E1165 Type 2 diabetes mellitus with hyperglycemia: Secondary | ICD-10-CM | POA: Diagnosis not present

## 2023-09-01 DIAGNOSIS — I10 Essential (primary) hypertension: Secondary | ICD-10-CM | POA: Diagnosis not present

## 2023-09-02 ENCOUNTER — Telehealth: Payer: Self-pay | Admitting: Internal Medicine

## 2023-09-02 NOTE — Telephone Encounter (Signed)
Dasharia  called in   Verbal [facility] OT dc   Week of 10/20  Call back [phone]

## 2023-09-02 NOTE — Telephone Encounter (Signed)
Verbal ok given.

## 2023-09-03 LAB — ATN PROFILE
A -- Beta-amyloid 42/40 Ratio: 0.091 — ABNORMAL LOW (ref >0.102)
Beta-amyloid 40: 240.36 pg/mL
Beta-amyloid 42: 21.87 pg/mL
N -- NfL, Plasma: 17.6 pg/mL — ABNORMAL HIGH (ref 0.00–7.64)
T -- p-tau181: 3.06 pg/mL — ABNORMAL HIGH (ref 0.00–0.97)

## 2023-09-09 ENCOUNTER — Telehealth: Payer: Self-pay | Admitting: Internal Medicine

## 2023-09-09 DIAGNOSIS — E559 Vitamin D deficiency, unspecified: Secondary | ICD-10-CM | POA: Diagnosis not present

## 2023-09-09 DIAGNOSIS — E1165 Type 2 diabetes mellitus with hyperglycemia: Secondary | ICD-10-CM | POA: Diagnosis not present

## 2023-09-09 DIAGNOSIS — M25561 Pain in right knee: Secondary | ICD-10-CM | POA: Diagnosis not present

## 2023-09-09 DIAGNOSIS — G8929 Other chronic pain: Secondary | ICD-10-CM | POA: Diagnosis not present

## 2023-09-09 DIAGNOSIS — Z9181 History of falling: Secondary | ICD-10-CM | POA: Diagnosis not present

## 2023-09-09 DIAGNOSIS — Z7984 Long term (current) use of oral hypoglycemic drugs: Secondary | ICD-10-CM | POA: Diagnosis not present

## 2023-09-09 DIAGNOSIS — M25512 Pain in left shoulder: Secondary | ICD-10-CM | POA: Diagnosis not present

## 2023-09-09 DIAGNOSIS — I10 Essential (primary) hypertension: Secondary | ICD-10-CM | POA: Diagnosis not present

## 2023-09-09 DIAGNOSIS — E785 Hyperlipidemia, unspecified: Secondary | ICD-10-CM | POA: Diagnosis not present

## 2023-09-09 NOTE — Telephone Encounter (Signed)
Left message

## 2023-09-09 NOTE — Telephone Encounter (Signed)
Dasharia ot with [facility]   Verbal orders   Move OT discharge to week of 10/27  Call back Disharia [phone]

## 2023-09-21 ENCOUNTER — Telehealth: Payer: Self-pay

## 2023-09-21 NOTE — Patient Outreach (Signed)
Successful call to patient on today regarding preventative mammogram screening. Patient's son declined at this time and will follow up with PCP at later date.  Baruch Gouty [facility] Assistant [facility] Population Health [phone]

## 2023-10-18 ENCOUNTER — Ambulatory Visit: Payer: Medicare Other | Admitting: Neurology

## 2023-10-27 ENCOUNTER — Telehealth: Payer: Self-pay

## 2023-10-27 NOTE — Telephone Encounter (Signed)
Copied from CRM [id number]. Topic: Medical Record Request - Records Request >> Oct 27, 2023  2:13 PM Mosetta Putt H wrote: Reason for CRM: Requesting records be sent to dr Alba Cory dusio also provided fax number to request documentation

## 2023-10-27 NOTE — Telephone Encounter (Signed)
Spoke to son and explained they need to fill out a record request threw that dr office, once they do that, that office will fax [place] requesting the records

## 2023-10-29 ENCOUNTER — Other Ambulatory Visit: Payer: Self-pay | Admitting: Internal Medicine

## 2023-10-29 DIAGNOSIS — I10 Essential (primary) hypertension: Secondary | ICD-10-CM

## 2023-11-01 DIAGNOSIS — R300 Dysuria: Secondary | ICD-10-CM | POA: Diagnosis not present

## 2023-11-01 DIAGNOSIS — E119 Type 2 diabetes mellitus without complications: Secondary | ICD-10-CM | POA: Diagnosis not present

## 2023-11-01 DIAGNOSIS — R42 Dizziness and giddiness: Secondary | ICD-10-CM | POA: Diagnosis not present

## 2023-11-01 DIAGNOSIS — I10 Essential (primary) hypertension: Secondary | ICD-10-CM | POA: Diagnosis not present

## 2023-11-18 ENCOUNTER — Ambulatory Visit: Payer: Medicare Other | Admitting: Internal Medicine

## 2024-02-29 ENCOUNTER — Ambulatory Visit: Payer: Medicare Other | Admitting: Neurology

## 2024-02-29 ENCOUNTER — Encounter: Payer: Self-pay | Admitting: Neurology

## 2024-02-29 VITALS — BP 157/77 | HR 77 | Ht 61.0 in | Wt 184.0 lb

## 2024-02-29 DIAGNOSIS — R269 Unspecified abnormalities of gait and mobility: Secondary | ICD-10-CM

## 2024-02-29 DIAGNOSIS — M25561 Pain in right knee: Secondary | ICD-10-CM | POA: Diagnosis not present

## 2024-02-29 DIAGNOSIS — M25562 Pain in left knee: Secondary | ICD-10-CM

## 2024-02-29 DIAGNOSIS — G301 Alzheimer's disease with late onset: Secondary | ICD-10-CM

## 2024-02-29 DIAGNOSIS — G8929 Other chronic pain: Secondary | ICD-10-CM

## 2024-02-29 DIAGNOSIS — F02B11 Dementia in other diseases classified elsewhere, moderate, with agitation: Secondary | ICD-10-CM | POA: Diagnosis not present

## 2024-02-29 NOTE — Progress Notes (Signed)
 [facility]  PATIENT: Tracy Salas DOB: 1951-06-23  REFERRING DOCTOR OR PCP:  Trena Platt, MD SOURCE: Patient, notes from primary care, imaging and lab reports, MRI images personally reviewed.  _________________________________   HISTORICAL  CHIEF COMPLAINT:  Chief Complaint  Patient presents with   Dementia    Rm11, son present, Mild dementia, unspecified dementia type, unspecified whether behavioral, psychotic, or mood disturbance or anxiety: moca score of 10, was given galantamine (RAZADYNE) 8 MG but refuses to take it     HISTORY OF PRESENT ILLNESS:  Tracy Salas is a 73 y.o. woman with Alzheimer's dementia   UPDATE 02/29/2024: Since the last visit, she had the ATN profile and the AB 42/40 ratio was reduced and [id number] and NfL were increased c/w AD.   MRI 07/2023 showed atrophy, more in left medial temporal lobe.    She could not tolerate donepezil.  She refuses to take galantamine.      Her son notes she repeats questions frequently.  She is getting frustrates easily.   She paces back and forth a lot. She is needing more reminders to do self care and chores.  She is more paranoid and sometimes   She usually sleeps well at night.  She gets irritated but is not combative.      She no longer drives (no event, just felt safer by family).  She n longer shops (she stated due to knee issues).   She has difficulty with dates.  She states she does her checkbook.   She rarely gets out of her house and spends much of her day watching TV and playing with her dog.  She does not have any hobbies.    We discussed trying to be more physically, cognitively and socially active.       History of dementia She had the onset of memory and other cognitive concerns around 2022.  This was progresssive In August, 2024, she woke up not knowing her son and D-I-L ad she called the [facility].   She was similar the next day and the family contacted their PCP.    He reports that she  is having more difficulty with some simple tasks like balancing her checkbook though she does not want her family to do it for her.  Donepezil was tried.  However, even on the low-dose of 5 mg the son felt that her cognition worsened and it was discontinued.  Her mother had dementia.   Her husband had Lewy Body dementia.       She has a poor gait but notes bilateral knee issues and she uses a cane.   Sometimes she feels dizzy but has never had syncope or LOC.   Hands/arms are fine.    She has urge incontinence a couple times a week.   She is supposed to wear glasses but does not always use.   Hearing is fine.       02/29/2024    3:06 PM 08/30/2023    8:50 AM 08/17/2023    3:44 PM  Montreal Cognitive Assessment   Visuospatial/ Executive (0/5) 1 3 4   Naming (0/3) 2 3 3   Attention: Read list of digits (0/2) 1 2 2   Attention: Read list of letters (0/1) 1 1 1   Attention: Serial 7 subtraction starting at 100 (0/3) 0 0 0  Language: Repeat phrase (0/2) 1 2 1   Language : Fluency (0/1) 0 0 1  Abstraction (0/2) 1 2 0  Delayed Recall (0/5) 0  0 0  Orientation (0/6) 3 3 2   Total 10 16 14   Adjusted Score (based on education)  17     Lab work from 07/27/2023 showed normal B12, normal TSH elevated hemoglobin A1c (7.2, she has DM)  MRI of the brain 08/09/2023 was personally reviewed.  She has mild atrophy, slightly more in the left temporal region compared to elsewhere.  There is mild to moderate chronic microvascular ischemic change.  No acute findings.  Examined a few things this/patient low back/  Vascular risk factors: Hypertension, diabetes.  She was never a smoker.   Constitutional: No fevers, chills, sweats, or change in appetite Eyes: No visual changes, double vision, eye pain Ear, nose and throat: No hearing loss, ear pain, nasal congestion, sore throat Cardiovascular: No chest pain, palpitations Respiratory:  No shortness of breath at rest or with exertion.   No  wheezes GastrointestinaI: No nausea, vomiting, diarrhea, abdominal pain, fecal incontinence Genitourinary:  No dysuria, urinary retention or frequency.  No nocturia. Musculoskeletal: She reports severe knee pain.  Mild back pain.   Integumentary: No rash, pruritus, skin lesions Neurological: as above Psychiatric: No depression at this time.  No anxiety Endocrine: No palpitations, diaphoresis, change in appetite, change in weigh or increased thirst.  She has diabetes. Hematologic/Lymphatic:  No anemia, purpura, petechiae. Allergic/Immunologic: No itchy/runny eyes, nasal congestion, recent allergic reactions, rashes  ALLERGIES: Allergies  Allergen Reactions   Penicillins Shortness Of Breath and Rash    Did it involve swelling of the face/tongue/throat, SOB, or low BP? Yes Did it involve sudden or severe rash/hives, skin peeling, or any reaction on the inside of your mouth or nose? Yes Did you need to seek medical attention at a hospital or doctor's office? Unknown When did it last happen? over 3 years--73 years old      If all above answers are "NO", may proceed with cephalosporin use.    Donepezil Other (See Comments)    Made pt Aggressive    Metformin And Related Itching and Other (See Comments)    Shaking, altered mental status    HOME MEDICATIONS:  Current Outpatient Medications:    ACCU-CHEK GUIDE test strip, USE AS DIRECTED, Disp: 100 strip, Rfl: 3   blood glucose meter kit and supplies, Dispense based on patient and insurance preference. Use once daily in morning before eating. (FOR ICD-10 E10.9, E11.9)., Disp: 1 each, Rfl: 0   clotrimazole-betamethasone (LOTRISONE) cream, Apply 1 application topically 2 (two) times daily., Disp: 30 g, Rfl: 3   glipiZIDE (GLUCOTROL) 10 MG tablet, TAKE ONE TABLET BY MOUTH EVERY MORNING AND 1/2 TABLET EVERY EVENING WITH MEALS, Disp: 90 tablet, Rfl: 1   glucose blood (ACCU-CHEK GUIDE) test strip, Check once daily, morning fasting check is  desired., Disp: 100 strip, Rfl: 3   hydrOXYzine (ATARAX) 10 MG tablet, Take one po every day prn agiatation, Disp: 30 tablet, Rfl: 5   lisinopril-hydrochlorothiazide (ZESTORETIC) 20-12.5 MG tablet, TAKE ONE TABLET BY MOUTH EVERY DAY, Disp: 90 tablet, Rfl: 3   meclizine (ANTIVERT) 25 MG tablet, Take 1 tablet (25 mg total) by mouth 3 (three) times daily as needed for dizziness., Disp: 30 tablet, Rfl: 0   Misc. Devices MISC, Blood pressure cuff/device - 1. ICD10: I10, Disp: 1 each, Rfl: 0   ondansetron (ZOFRAN) 4 MG tablet, Take 1 tablet (4 mg total) by mouth every 8 (eight) hours as needed for nausea or vomiting., Disp: 20 tablet, Rfl: 0   vitamin C (ASCORBIC ACID) 500 MG tablet, Take  500 mg by mouth daily., Disp: , Rfl:   PAST MEDICAL HISTORY: Past Medical History:  Diagnosis Date   Dermatitis, unspecified 03/10/2020   Diabetes mellitus without complication (HCC)    Hypertension    Vitamin D deficiency     PAST SURGICAL HISTORY: Past Surgical History:  Procedure Laterality Date   CHOLECYSTECTOMY     TUMOR REMOVAL  2018    FAMILY HISTORY: Family History  Problem Relation Age of Onset   Alzheimer's disease Mother    Stroke Father     SOCIAL HISTORY: Social History   Socioeconomic History   Marital status: Widowed    Spouse name: Not on file   Number of children: 1   Years of education: Not on file   Highest education level: Not on file  Occupational History   Occupation: retired    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  Tobacco Use   Smoking status: Never   Smokeless tobacco: Never  Vaping Use   Vaping status: Never Used  Substance and Sexual Activity   Alcohol use: Never   Drug use: Never   Sexual activity: Not Currently  Other Topics Concern   Not on file  Social History Narrative   Lives with son- Allison Ivory       Dogs,  Licensed conveyancer, Snake       Enjoy: tv- stories; HGTV -house flipping       Diet: eats all food groups   Caffeine: 1 cup- daily if that     Water: 6-8 cups daily       Wears seat belt   Does not use phone while driving    Engineer, civil (consulting)- safe     Social Drivers of Health   Financial Resource Strain: Low Risk  (12/03/2021)   Overall Financial Resource Strain (CARDIA)    Difficulty of Paying Living Expenses: Not hard at all  Food Insecurity: No Food Insecurity (12/03/2021)   Hunger Vital Sign    Worried About Running Out of Food in the Last Year: Never true    Ran Out of Food in the Last Year: Never true  Transportation Needs: No Transportation Needs (12/03/2021)   PRAPARE - Administrator, Civil Service (Medical): No    Lack of Transportation (Non-Medical): No  Physical Activity: Inactive (12/03/2021)   Exercise Vital Sign    Days of Exercise per Week: 0 days    Minutes of Exercise per Session: 0 min  Stress: No Stress Concern Present (12/03/2021)   [employer] of Occupational Health - Occupational Stress Questionnaire    Feeling of Stress : Not at all  Social Connections: Socially Isolated (12/03/2021)   Social Connection and Isolation Panel [NHANES]    Frequency of Communication with Friends and Family: More than three times a week    Frequency of Social Gatherings with Friends and Family: Twice a week    Attends Religious Services: Never    Database administrator or Organizations: No    Attends Banker Meetings: Never    Marital Status: Widowed  Intimate Partner Violence: Not At Risk (12/03/2021)   Humiliation, Afraid, Rape, and Kick questionnaire    Fear of Current or Ex-Partner: No    Emotionally Abused: No    Physically Abused: No    Sexually Abused: No       PHYSICAL EXAM  Vitals:   02/29/24 1504 02/29/24 1517  BP: (!) 159/65 (!) 157/77  Pulse:  77 77  Weight: 184 lb (83.5 kg)   Height: 5\' 1"  (1.549 m)     Body mass index is 34.77 kg/m.   General: The patient is well-developed and well-nourished and in no acute distress  HEENT:   Head is [place]/AT.  Sclera are anicteric.     Skin: Extremities are without rash or  edema.    Neurologic Exam  Mental status: The patient is alert and oriented x 2 at the time of the examination she scored 10/30 on the [facility] cognitive assessment (mild to moderate dementia) and missed all 5 points for short-term recall.  Attention was reduced.  Speech and language are normal.  Cranial nerves: Extraocular movements are full.   Facial symmetry is present. There is good facial sensation to soft touch bilaterally.Facial strength is normal.  Trapezius and sternocleidomastoid strength is normal. No dysarthria is noted.  No obvious hearing deficits are noted.  Motor:  Muscle bulk is normal.   Tone is normal. Strength is  5 / 5 in all 4 extremities.  Except 4+/5 toe extension.  Sensory: Sensory testing is intact to pinprick, soft touch and vibration sensation in all 4 extremities.  Coordination: Cerebellar testing reveals good finger-nose-finger and heel-to-shin bilaterally.  Gait and station: Station is normal.   She does use a chair to help her arise.  Her gait is very arthritic (knee pain).  Reduced stride and wide base.  She cannot tandem.  Romberg was not tested due to the poor gait.  Reflexes: Deep tendon reflexes are 1 and symmetric  bilaterally.  [name]    DIAGNOSTIC DATA (LABS, IMAGING, TESTING) - I reviewed patient records, labs, notes, testing and imaging myself where available.  Lab Results  Component Value Date   WBC 10.7 07/27/2023   HGB 14.9 07/27/2023   HCT 45.0 07/27/2023   MCV 90 07/27/2023   PLT 348 07/27/2023      Component Value Date/Time   NA 143 07/27/2023 1040   K 4.1 07/27/2023 1040   CL 102 07/27/2023 1040   CO2 22 07/27/2023 1040   GLUCOSE 185 (H) 07/27/2023 1040   GLUCOSE 196 (H) 11/29/2018 1415   BUN 29 (H) 07/27/2023 1040   CREATININE 1.16 (H) 07/27/2023 1040   CALCIUM 10.0 07/27/2023 1040   PROT 6.9 07/27/2023 1040   ALBUMIN 4.3 07/27/2023 1040   AST 19  07/27/2023 1040   ALT 13 07/27/2023 1040   ALKPHOS 123 (H) 07/27/2023 1040   BILITOT 0.6 07/27/2023 1040   GFRNONAA 63 08/03/2019 0000   GFRAA 73 08/03/2019 0000   Lab Results  Component Value Date   CHOL 183 09/17/2022   HDL 49 09/17/2022   LDLCALC 114 (H) 09/17/2022   TRIG 109 09/17/2022   CHOLHDL 3.7 09/17/2022   Lab Results  Component Value Date   HGBA1C 7.2 (H) 07/27/2023   Lab Results  Component Value Date   VITAMINB12 354 07/27/2023   Lab Results  Component Value Date   TSH 0.826 07/27/2023       ASSESSMENT AND PLAN  Moderate late onset Alzheimer's dementia with agitation (HCC)  Gait disturbance  Chronic pain of both knees   I had a long discussion with her and her son about the testing and her clinical presentation being consistent with Alzheimer's disease.  She is in the mild to moderate range.  She was unable to tolerate donepezil and does not want to take galantamine or memantine.  She has had some behavior issues, less interest in personal  hygiene and favorite sweets.  It is possible that she has frontotemporal dementia though the blood work is more consistent with Alzheimer's disease. She has had some behavioral issues.  I had given a prescription for hydroxyzine 10 mg.  I advised the son to have her take 1 at dinnertime since most of her agitation occurs after dinner and a second 1 could be taken based on response Will return to see me as needed for significant new or worsening neurologic symptoms.  This visit is part of a comprehensive longitudinal care medical relationship regarding the patients primary diagnosis of dementia and related concerns.  Jabreel Chimento A. Godwin Lat, MD, [facility] 02/29/2024, 4:58 PM Certified in Neurology, Clinical Neurophysiology, Sleep Medicine and Neuroimaging  [facility] [address] [place], [place] [address] [phone]

## 2024-06-21 ENCOUNTER — Other Ambulatory Visit: Payer: Self-pay | Admitting: Internal Medicine

## 2024-06-21 DIAGNOSIS — E1165 Type 2 diabetes mellitus with hyperglycemia: Secondary | ICD-10-CM
